# Patient Record
Sex: Female | Born: 1960 | Race: White | Hispanic: No | Marital: Married | Smoking: Never smoker
Health system: Southern US, Community
[De-identification: ages and names within clinical notes are randomized; demographics above are authoritative.]

## PROBLEM LIST (undated history)

## (undated) DIAGNOSIS — C4491 Basal cell carcinoma of skin, unspecified: Secondary | ICD-10-CM

## (undated) DIAGNOSIS — E7800 Pure hypercholesterolemia, unspecified: Secondary | ICD-10-CM

## (undated) DIAGNOSIS — E538 Deficiency of other specified B group vitamins: Secondary | ICD-10-CM

## (undated) HISTORY — DX: Basal cell carcinoma of skin, unspecified: C44.91

## (undated) HISTORY — PX: OTHER SURGICAL HISTORY: SHX169

## (undated) HISTORY — DX: Deficiency of other specified B group vitamins: E53.8

---

## 1990-09-26 HISTORY — PX: APPENDECTOMY: SHX54

## 2011-10-18 ENCOUNTER — Ambulatory Visit (INDEPENDENT_AMBULATORY_CARE_PROVIDER_SITE_OTHER): Payer: BC Managed Care – PPO

## 2011-10-18 DIAGNOSIS — H66009 Acute suppurative otitis media without spontaneous rupture of ear drum, unspecified ear: Secondary | ICD-10-CM

## 2011-10-18 DIAGNOSIS — H9209 Otalgia, unspecified ear: Secondary | ICD-10-CM

## 2011-10-18 DIAGNOSIS — H6120 Impacted cerumen, unspecified ear: Secondary | ICD-10-CM

## 2013-10-30 ENCOUNTER — Ambulatory Visit (INDEPENDENT_AMBULATORY_CARE_PROVIDER_SITE_OTHER): Payer: BC Managed Care – PPO | Admitting: Physician Assistant

## 2013-10-30 VITALS — BP 120/80 | HR 77 | Temp 98.0°F | Resp 16 | Ht 68.0 in | Wt 142.0 lb

## 2013-10-30 DIAGNOSIS — J029 Acute pharyngitis, unspecified: Secondary | ICD-10-CM

## 2013-10-30 DIAGNOSIS — H6980 Other specified disorders of Eustachian tube, unspecified ear: Secondary | ICD-10-CM

## 2013-10-30 DIAGNOSIS — H6690 Otitis media, unspecified, unspecified ear: Secondary | ICD-10-CM

## 2013-10-30 DIAGNOSIS — H6990 Unspecified Eustachian tube disorder, unspecified ear: Secondary | ICD-10-CM

## 2013-10-30 MED ORDER — AMOXICILLIN 875 MG PO TABS: 875.0000 mg | ORAL_TABLET | Freq: Two times a day (BID) | ORAL | Status: DC

## 2013-10-30 MED ORDER — IPRATROPIUM BROMIDE 0.03 % NA SOLN: 2.0000 | Freq: Two times a day (BID) | NASAL | Status: DC

## 2013-10-30 MED ORDER — FIRST-DUKES MOUTHWASH MT SUSP: 5.0000 mL | OROMUCOSAL | Status: DC | PRN

## 2013-10-30 NOTE — Progress Notes (Signed)
   Subjective:    Patient ID: Sonya Hunt, female    DOB: [birth year], 53 y.o.   MRN: [id number]  HPI 53 year old female presents for evaluation of acute onset of sore throat and left sided ear pain. States symptoms started last night and have progressively worsened though the day today.  Admits her pain is only on the left side of her throat and in her left ear.  Is able to swallow and does not have significant pain with swallowing.  She has taken Motrin and 1 Benadryl last night which helped some. Has slight nasal congestion and PND.  Denies cough, SOB, wheezing, chest pain, nausea, vomiting, headache, sinus pain, fever, or chills.  No known flu or strep contacts.   Patient is otherwise healthy with no other concerns today.     Review of Systems  Constitutional: Negative for fever and chills.  HENT: Positive for congestion, ear pain (left side), postnasal drip, rhinorrhea and sore throat (left side). Negative for sinus pressure.   Respiratory: Negative for cough, chest tightness, shortness of breath and wheezing.   Gastrointestinal: Negative for nausea, vomiting and abdominal pain.  Neurological: Negative for dizziness and headaches.       Objective:   Physical Exam  Constitutional: She is oriented to person, place, and time. She appears well-developed and well-nourished.  HENT:  Head: Normocephalic and atraumatic.  Right Ear: Hearing, tympanic membrane, external ear and ear canal normal.  Left Ear: Hearing, external ear and ear canal normal. Tympanic membrane is erythematous.  Mouth/Throat: Uvula is midline and mucous membranes are normal. Posterior oropharyngeal erythema (left side) present. No oropharyngeal exudate, posterior oropharyngeal edema or tonsillar abscesses.  Eyes: Conjunctivae are normal.  Neck: Normal range of motion. Neck supple.  Cardiovascular: Normal rate, regular rhythm and normal heart sounds.   Pulmonary/Chest: Effort normal and breath sounds normal.    Lymphadenopathy:    She has no cervical adenopathy.  Neurological: She is alert and oriented to person, place, and time.  Psychiatric: She has a normal mood and affect. Her behavior is normal. Judgment and thought content normal.          Assessment & Plan:  Otitis media - Plan: amoxicillin (AMOXIL) 875 MG tablet  ETD (eustachian tube dysfunction) - Plan: ipratropium (ATROVENT) 0.03 % nasal spray  Acute pharyngitis - Plan: Diphenhyd-Hydrocort-Nystatin (FIRST-DUKES MOUTHWASH) SUSP  Likely viral pharyngitis but will cover OM with amoxicillin twice daily x 10 days Atrovent NS twice daily to help with drainage and nasal congestion Increase fluids and rest. Duke's mouthwash q2-3hours prn pain Continue Motrin 600-800 mg tid prn pain Follow up if symptoms worsen or fail to improve.

## 2013-11-03 ENCOUNTER — Ambulatory Visit: Payer: BC Managed Care – PPO

## 2013-11-03 ENCOUNTER — Ambulatory Visit (INDEPENDENT_AMBULATORY_CARE_PROVIDER_SITE_OTHER): Payer: BC Managed Care – PPO | Admitting: Emergency Medicine

## 2013-11-03 VITALS — BP 128/68 | HR 78 | Temp 97.7°F | Ht 68.0 in | Wt 142.2 lb

## 2013-11-03 DIAGNOSIS — J029 Acute pharyngitis, unspecified: Secondary | ICD-10-CM

## 2013-11-03 DIAGNOSIS — H9202 Otalgia, left ear: Secondary | ICD-10-CM

## 2013-11-03 DIAGNOSIS — H9209 Otalgia, unspecified ear: Secondary | ICD-10-CM

## 2013-11-03 MED ORDER — ANTIPYRINE-BENZOCAINE 5.4-1.4 % OT SOLN: 3.0000 [drp] | OTIC | Status: DC | PRN

## 2013-11-03 MED ORDER — MELOXICAM 15 MG PO TABS: 15.0000 mg | ORAL_TABLET | Freq: Every day | ORAL | Status: DC

## 2013-11-03 NOTE — Patient Instructions (Signed)
Temporomandibular Problems  Temporomandibular joint (TMJ) dysfunction means there are problems with the joint between your jaw and your skull. This is a joint lined by cartilage like other joints in your body but also has a small disc in the joint which keeps the bones from rubbing on each other. These joints are like other joints and can get inflamed (sore) from arthritis and other problems. When this joint gets sore, it can cause headaches and pain in the jaw and the face. CAUSES  Usually the arthritic types of problems are caused by soreness in the joint. Soreness in the joint can also be caused by overuse. This may come from grinding your teeth. It may also come from mis-alignment in the joint. DIAGNOSIS Diagnosis of this condition can often be made by history and exam. Sometimes your caregiver may need X-rays or an MRI scan to determine the exact cause. It may be necessary to see your dentist to determine if your teeth and jaws are lined up correctly. TREATMENT  Most of the time this problem is not serious; however, sometimes it can persist (become chronic). When this happens medications that will cut down on inflammation (soreness) help. Sometimes a shot of cortisone into the joint will be helpful. If your teeth are not aligned it may help for your dentist to make a splint for your mouth that can help this problem. If no physical problems can be found, the problem may come from tension. If tension is found to be the cause, biofeedback or relaxation techniques may be helpful. HOME CARE INSTRUCTIONS   Later in the day, applications of ice packs may be helpful. Ice can be used in a plastic bag with a towel around it to prevent frostbite to skin. This may be used about every 2 hours for 20 to 30 minutes, as needed while awake, or as directed by your caregiver.  Only take over-the-counter or prescription medicines for pain, discomfort, or fever as directed by your caregiver.  If physical therapy was  prescribed, follow your caregiver's directions.  Wear mouth appliances as directed if they were given. Document Released: 06/07/2001 Document Revised: 12/05/2011 Document Reviewed: 09/14/2008 ExitCare Patient Information 2014 [employer].  

## 2013-11-03 NOTE — Progress Notes (Signed)
Subjective:    Patient ID: Sonya Hunt, female    DOB: 1961-08-20, 53 y.o.   MRN: [id number]  HPI This chart was scribed for Remo Lipps Arland Usery-MD, by Lovena Le Day, Scribe. This patient was seen in room 9 and the patient's care was started at 12:04 PM.  HPI Comments: Sonya Hunt is a 53 y.o. female who presents to the [facility] complaining of a constant, gradually worsened left otalgia and left sore throat, onset 4 days ago when she was seen here for the same. She reports was tx w/AMX and reports it feels the like a burning and aching pain to her left throat/left ear which has somewhat worsened since her last visit. She denies cough, fever. She states her right ear and right throat are fine and having no problems.   There are no active problems to display for this patient.  Past Surgical History  Procedure Laterality Date  . Appendectomy     Family History  Problem Relation Age of Onset  . Heart disease Mother   . Hypertension Mother   . Cancer Father   . Hypertension Brother    History   Social History  . Marital Status: Married    Spouse Name: N/A    Number of Children: N/A  . Years of Education: N/A   Occupational History  . Not on file.   Social History Main Topics  . Smoking status: Never Smoker   . Smokeless tobacco: Not on file  . Alcohol Use: No  . Drug Use: No  . Sexual Activity: Not on file   Other Topics Concern  . Not on file   Social History Narrative  . No narrative on file   No Known Allergies  No results found for this or any previous visit.  Review of Systems  Constitutional: Negative for fever and chills.  HENT: Positive for ear pain (left ear pain) and sore throat (left sided sore throat).   Respiratory: Negative for cough and shortness of breath.   Cardiovascular: Negative for chest pain.  Gastrointestinal: Negative for abdominal pain.  Musculoskeletal: Negative for back pain.      Objective:   Physical Exam    Nursing note and vitals reviewed. Constitutional: Patient is oriented to person, place, and time. Patient appears well-developed and well-nourished. No distress.  HENT:  Head: Normocephalic and atraumatic.  Neck: Neck supple. No tracheal deviation present.  Cardiovascular: Normal rate, regular rhythm and normal heart sounds.   No murmur heard. Pulmonary/Chest: Effort normal and breath sounds normal. No respiratory distress. Patient has no wheezes. Patient has no rales.  Musculoskeletal: Normal range of motion.  Neurological: Patient is alert and oriented to person, place, and time.  Skin: Skin is warm and dry.  Psychiatric: Patient has a normal mood and affect. Patient's behavior is normal.  Her left lower second molar is exposed and is missing the. Weber  Rinne testing are normal Triage Vitals: BP 128/68  Pulse 78  Temp(Src) 97.7 F (36.5 C) (Oral)  Ht 5\' 8"  (1.727 m)  Wt 142 lb 4 oz (64.524 kg)  BMI 21.63 kg/m2  SpO2 99%  DIAGNOSTIC STUDIES: Oxygen Saturation is 99% on room air, normal by my interpretation.    COORDINATION OF CARE: At 1203 PM Discussed treatment plan with patient which includes . Patient agrees.   [facility] reading (PRIMARY) by  Dr. Everlene Farrier no acute disease.      Assessment & Plan:  No source for the ear pain  found. 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 She is requesting a row given eardrops which are okay with me. Also put her on Mobic 15 mg one a day and advised her to see her dentist.

## 2013-12-25 ENCOUNTER — Ambulatory Visit: Payer: BC Managed Care – PPO

## 2013-12-25 ENCOUNTER — Ambulatory Visit (INDEPENDENT_AMBULATORY_CARE_PROVIDER_SITE_OTHER): Payer: BC Managed Care – PPO | Admitting: Family Medicine

## 2013-12-25 VITALS — BP 140/80 | HR 79 | Temp 98.1°F | Resp 16 | Ht 68.0 in | Wt 143.0 lb

## 2013-12-25 DIAGNOSIS — R059 Cough, unspecified: Secondary | ICD-10-CM

## 2013-12-25 DIAGNOSIS — J209 Acute bronchitis, unspecified: Secondary | ICD-10-CM

## 2013-12-25 DIAGNOSIS — R05 Cough: Secondary | ICD-10-CM

## 2013-12-25 MED ORDER — HYDROCODONE-HOMATROPINE 5-1.5 MG/5ML PO SYRP: 5.0000 mL | ORAL_SOLUTION | Freq: Three times a day (TID) | ORAL | Status: DC | PRN

## 2013-12-25 MED ORDER — AZITHROMYCIN 250 MG PO TABS: ORAL_TABLET | ORAL | Status: DC

## 2013-12-25 NOTE — Progress Notes (Addendum)
° °  Subjective:    Patient ID: Sonya Hunt, female    DOB: 1961-03-23, 53 y.o.   MRN: [id number]  This chart was scribed for Robyn Haber, MD by Maree Erie, ED Scribe.   Chief Complaint  Patient presents with   Cough    chest pain from coughing x 3 days    PCP: No primary provider on file.   HPI  Sonya Hunt is a 53 y.o. female who presents to office complaining of a productive cough that began three days ago. She states that the cough started out mildly but a few hours later she had sudden onset of chills, low grade fever and worsening cough. Since that day she has not had fever or chills. However, she reports a persistent, unchanged cough and an associated "heaviness" in her chest with the coughing. She also reports mild congestion. She denies sore throat. She denies a history of asthma. She does not smoke.   She works at [employer].     History reviewed. No pertinent past medical history.   Review of Systems Review of Systems: Consitutional: No fever, chills, fatigue, night sweats, lymphadenopathy, or weight changes. Eyes: No visual changes, eye redness, or discharge. ENT/Mouth: Ears: No otalgia, tinnitus, hearing loss, discharge. Nose: Positive for congestion, rhinorrhea. No sinus pain, or epistaxis. Throat: No sore throat, post nasal drip, or teeth pain. Cardiovascular: No CP, palpitations, diaphoresis, DOE, edema, orthopnea, PND. Respiratory: Positive for cough, No hemoptysis, SOB, or wheezing. Gastrointestinal: No anorexia, dysphagia, reflux, pain, nausea, vomiting, hematemesis, diarrhea, constipation, BRBPR, or melena. Genitourinary: No dysuria, frequency, urgency, hematuria, incontinence, nocturia, decreased urinary stream, discharge, impotence, or testicular pain/masses. Musculoskeletal: No decreased ROM, myalgias, stiffness, joint swelling, or weakness. Skin: No rash, erythema, lesion changes, pain, warmth, jaundice, or pruritis. Neurological:  No headache, dizziness, syncope, seizures, tremors, memory loss, coordination problems, or paresthesias. Psychological: No anxiety, depression, hallucinations, SI/HI. Endocrine: No fatigue, polydipsia, polyphagia or polyuria. All other systems were reviewed and are otherwise negative.     Objective:   Physical Exam BP 140/80   Pulse 79   Temp(Src) 98.1 F (36.7 C) (Oral)   Resp 16   Ht 5\' 8"  (1.727 m)   Wt 143 lb (64.864 kg)   BMI 21.75 kg/m2   SpO2 98%  General: Well-developed, well-nourished female in no acute distress; appearance consistent with age of record HENT: normocephalic; atraumatic Eyes: pupils equal, round and reactive to light; extraocular muscles intact Neck: supple Heart: regular rate and rhythm; no murmurs, rubs or gallops Lungs: clear to auscultation bilaterally, decreased BS on right Abdomen: soft; nondistended; nontender; no masses or hepatosplenomegaly; bowel sounds present Extremities: No deformity; full range of motion; pulses normal Neurologic: Awake, alert and oriented; motor function intact in all extremities and symmetric; no facial droop Skin: Warm and dry Psychiatric: Normal mood and affect [facility] reading (PRIMARY) by  Dr. Joseph Art CXR.no infiltrate   Assessment & Plan:   I personally performed the services described in this documentation, which was scribed in my presence. The recorded information has been reviewed and is accurate.  Acute bronchitis - Plan: azithromycin (ZITHROMAX Z-PAK) 250 MG tablet, HYDROcodone-homatropine (HYCODAN) 5-1.5 MG/5ML syrup  Cough - Plan: DG Chest 2 View  Signed, Robyn Haber, MD  PS patient plans to get complete physical and up-to-date on preventative care in the next 3 months.

## 2014-04-12 ENCOUNTER — Emergency Department (HOSPITAL_COMMUNITY)
Admission: EM | Admit: 2014-04-12 | Discharge: 2014-04-12 | Disposition: A | Discharge status: Home / Self Care | Payer: BC Managed Care – PPO | Attending: Emergency Medicine | Admitting: Emergency Medicine

## 2014-04-12 ENCOUNTER — Emergency Department (HOSPITAL_COMMUNITY): Payer: BC Managed Care – PPO

## 2014-04-12 ENCOUNTER — Encounter (HOSPITAL_COMMUNITY): Payer: Self-pay | Admitting: Emergency Medicine

## 2014-04-12 DIAGNOSIS — R000 Tachycardia, unspecified: Secondary | ICD-10-CM | POA: Insufficient documentation

## 2014-04-12 DIAGNOSIS — R002 Palpitations: Secondary | ICD-10-CM | POA: Insufficient documentation

## 2014-04-12 DIAGNOSIS — R0789 Other chest pain: Secondary | ICD-10-CM | POA: Insufficient documentation

## 2014-04-12 LAB — CBC
HCT: 39.9 % (ref 36.0–46.0)
Hemoglobin: 13.6 g/dL (ref 12.0–15.0)
MCH: 30.4 pg (ref 26.0–34.0)
MCHC: 34.1 g/dL (ref 30.0–36.0)
MCV: 89.3 fL (ref 78.0–100.0)
Platelets: 227 10*3/uL (ref 150–400)
RBC: 4.47 MIL/uL (ref 3.87–5.11)
RDW: 12.6 % (ref 11.5–15.5)
WBC: 7 10*3/uL (ref 4.0–10.5)

## 2014-04-12 LAB — I-STAT TROPONIN, ED: Troponin i, poc: 0 ng/mL (ref 0.00–0.08)

## 2014-04-12 LAB — BASIC METABOLIC PANEL
Anion gap: 20 — ABNORMAL HIGH (ref 5–15)
BUN: 11 mg/dL (ref 6–23)
CO2: 23 mEq/L (ref 19–32)
Calcium: 9.5 mg/dL (ref 8.4–10.5)
Chloride: 99 mEq/L (ref 96–112)
Creatinine, Ser: 0.67 mg/dL (ref 0.50–1.10)
GFR calc Af Amer: >90 mL/min (ref >90)
GFR calc non Af Amer: >90 mL/min (ref >90)
Glucose, Bld: 101 mg/dL — ABNORMAL HIGH (ref 70–99)
Potassium: 3.4 mEq/L — ABNORMAL LOW (ref 3.7–5.3)
Sodium: 142 mEq/L (ref 137–147)

## 2014-04-12 MED ORDER — POTASSIUM CHLORIDE CRYS ER 20 MEQ PO TBCR
40.0000 meq | EXTENDED_RELEASE_TABLET | Freq: Two times a day (BID) | ORAL | Status: DC
  Administered 2014-04-12: 40 meq via ORAL
  Filled 2014-04-12: qty 2

## 2014-04-12 NOTE — Discharge Instructions (Signed)
Nonspecific Tachycardia  Tachycardia is a faster than normal heartbeat (more than 100 beats per minute). In adults, the heart normally beats between 60 and 100 times a minute. A fast heartbeat may be a normal response to exercise or stress. It does not necessarily mean that something is wrong. However, sometimes when your heart beats too fast it may not be able to pump enough blood to the rest of your body. This can result in chest pain, shortness of breath, dizziness, and even fainting. Nonspecific tachycardia means that the specific cause or pattern of your tachycardia is unknown.  CAUSES   Tachycardia may be harmless or it may be due to a more serious underlying cause. Possible causes of tachycardia include:  · Exercise or exertion.  · Fever.  · Pain or injury.  · Infection.  · Loss of body fluids (dehydration).  · Overactive thyroid.  · Lack of red blood cells (anemia).  · Anxiety and stress.  · Alcohol.  · Caffeine.  · Tobacco products.  · Diet pills.  · Illegal drugs.  · Heart disease.  SYMPTOMS  · Rapid or irregular heartbeat (palpitations).  · Suddenly feeling your heart beating (cardiac awareness).  · Dizziness.  · Tiredness (fatigue).  · Shortness of breath.  · Chest pain.  · Nausea.  · Fainting.  DIAGNOSIS   Your caregiver will perform a physical exam and take your medical history. In some cases, a heart specialist (cardiologist) may be consulted. Your caregiver may also order:  · Blood tests.  · Electrocardiography. This test records the electrical activity of your heart.  · A heart monitoring test.  TREATMENT   Treatment will depend on the likely cause of your tachycardia. The goal is to treat the underlying cause of your tachycardia. Treatment methods may include:  · Replacement of fluids or blood through an intravenous (IV) tube for moderate to severe dehydration or anemia.  · New medicines or changes in your current medicines.  · Diet and lifestyle changes.  · Treatment for certain  infections.  · Stress relief or relaxation methods.  HOME CARE INSTRUCTIONS   · Rest.  · Drink enough fluids to keep your urine clear or pale yellow.  · Do not smoke.  · Avoid:  ¨ Caffeine.  ¨ Tobacco.  ¨ Alcohol.  ¨ Chocolate.  ¨ Stimulants such as over-the-counter diet pills or pills that help you stay awake.  ¨ Situations that cause anxiety or stress.  ¨ Illegal drugs such as marijuana, phencyclidine (PCP), and cocaine.  · Only take medicine as directed by your caregiver.  · Keep all follow-up appointments as directed by your caregiver.  SEEK IMMEDIATE MEDICAL CARE IF:   · You have pain in your chest, upper arms, jaw, or neck.  · You become weak, dizzy, or feel faint.  · You have palpitations that will not go away.  · You vomit, have diarrhea, or pass blood in your stool.  · Your skin is cool, pale, and wet.  · You have a fever that will not go away with rest, fluids, and medicine.  MAKE SURE YOU:   · Understand these instructions.  · Will watch your condition.  · Will get help right away if you are not doing well or get worse.  Document Released: 10/20/2004 Document Revised: 12/05/2011 Document Reviewed: 08/23/2011  ExitCare® Patient Information ©2015 [employer]. This information is not intended to replace advice given to you by your health care provider. Make sure you discuss any questions   you have with your health care provider.

## 2014-04-12 NOTE — ED Notes (Signed)
Patient transported to X-ray 

## 2014-04-12 NOTE — ED Notes (Signed)
Reports not feeling her norm for the past few days, feels like heart is racing. Mild pain and "feeling funny" to right arm. HR 130 at triage.

## 2014-04-14 NOTE — ED Provider Notes (Signed)
CSN: [id number]     Arrival date & time 04/12/14  1521 History   First MD Initiated Contact with Patient 04/12/14 1604     Chief Complaint  Patient presents with  . Tachycardia     (Consider location/radiation/quality/duration/timing/severity/associated sxs/prior Treatment) Patient is a 53 y.o. female presenting with palpitations.  Palpitations Palpitations quality:  Regular Onset quality:  Sudden Duration:  3 weeks Timing:  Constant Progression:  Waxing and waning Chronicity:  New Associated symptoms: no back pain, no chest pain, no cough, no nausea, no shortness of breath and no vomiting     History reviewed. No pertinent past medical history. Past Surgical History  Procedure Laterality Date  . Appendectomy     Family History  Problem Relation Age of Onset  . Heart disease Mother   . Hypertension Mother   . Cancer Father   . Hypertension Brother    History  Substance Use Topics  . Smoking status: Never Smoker   . Smokeless tobacco: Not on file  . Alcohol Use: No   OB History   Grav Para Term Preterm Abortions TAB SAB Ect Mult Living                 Review of Systems  Constitutional: Negative for activity change.  HENT: Negative for congestion.   Respiratory: Positive for chest tightness. Negative for cough and shortness of breath.   Cardiovascular: Positive for palpitations. Negative for chest pain and leg swelling.  Gastrointestinal: Negative for nausea, vomiting, abdominal pain, diarrhea, constipation, blood in stool and abdominal distention.  Genitourinary: Negative for dysuria, flank pain and vaginal discharge.  Musculoskeletal: Negative for back pain.  Skin: Negative for color change.  Neurological: Negative for syncope and headaches.  Psychiatric/Behavioral: Negative for agitation.  All other systems reviewed and are negative.     Allergies  Review of patient's allergies indicates no known allergies.  Home Medications   Prior to Admission  medications   Not on File   BP 133/76  Pulse 81  Temp(Src) 98.3 F (36.8 C) (Oral)  Resp 11  Ht 5\' 8"  (1.727 m)  Wt 140 lb (63.504 kg)  BMI 21.29 kg/m2  SpO2 99% Physical Exam  Constitutional: She is oriented to person, place, and time. She appears well-developed.  HENT:  Head: Normocephalic.  Eyes: Pupils are equal, round, and reactive to light.  Neck: Neck supple.  Cardiovascular: Normal rate.  Exam reveals no gallop and no friction rub.   No murmur heard. Normal HR at time of my exam   Pulmonary/Chest: Effort normal and breath sounds normal. No respiratory distress.  Abdominal: Soft. She exhibits no distension. There is no tenderness. There is no rebound.  Musculoskeletal: She exhibits no edema.  Neurological: She is alert and oriented to person, place, and time.  Skin: Skin is warm.  Psychiatric: She has a normal mood and affect.    ED Course  Procedures (including critical care time) Labs Review Labs Reviewed  BASIC METABOLIC PANEL - Abnormal; Notable for the following:    Potassium 3.4 (*)    Glucose, Bld 101 (*)    Anion gap 20 (*)    All other components within normal limits  CBC  I-STAT TROPOININ, ED    Imaging Review Dg Chest 2 View  04/12/2014   CLINICAL DATA:  Tachycardia.  EXAM: CHEST  2 VIEW  COMPARISON:  December 25, 2013.  FINDINGS: The heart size and mediastinal contours are within normal limits. No pneumothorax or pleural effusion is noted.  Both lungs are clear. The visualized skeletal structures are unremarkable.  IMPRESSION: No acute cardiopulmonary abnormality seen.   Electronically Signed   By: Sabino Dick M.D.   On: 04/12/2014 16:45     EKG Interpretation   Date/Time:  Saturday April 12 2014 20:29:13 EDT Ventricular Rate:  74 PR Interval:  149 QRS Duration: 87 QT Interval:  384 QTC Calculation: 426 R Axis:   75 Text Interpretation:  Sinus rhythm ED PHYSICIAN INTERPRETATION AVAILABLE  IN [facility] Confirmed by TEST, Record ([id number]) on  04/14/2014 7:18:51  AM      MDM   Final diagnoses:  Tachycardia  53 year old female with no significant past medical history the presents with tachycardia. Patient states that she's been feeling intermittent episodes of palpitations for the past 3 weeks. Patient cannot identify any alleviating or aggravating factors for this. Patient was concerned and states that his primary care doctor so and presented to the emergency department. Patient was noted to have a heart rate of 1:30 in triage. EKG was performed at that time and she was noted to have sinus tachycardia. No evidence of ischemia seen.  Patient denies any shortness of breath chest pain states that she is physically active and not a smoker. During the exam the patient has a heart rate of 84 without intervention. Orthostatic blood pressures were performed patient is not orthostatic. Screening labs were obtained and were noncontributory. Troponin negative. Repeat EKG obtained the patient is noted to have normal sinus rhythm with no evidence of ischemia.  At this time the patient was discharged home with instructions to establish a primary care doctor in contact information provided. Contact information for cardiology also provided for a nonurgent appointment. At this time the patient was discharged home with return precautions given     Claudean Severance, MD 04/14/14 Rogers, MD 04/14/14 [id number]

## 2014-04-17 NOTE — ED Provider Notes (Signed)
I saw and evaluated the patient, reviewed the resident's note and I agree with the findings and plan.   EKG Interpretation   Date/Time:  Saturday April 12 2014 20:29:13 EDT Ventricular Rate:  74 PR Interval:  149 QRS Duration: 87 QT Interval:  384 QTC Calculation: 426 R Axis:   75 Text Interpretation:  Sinus rhythm ED PHYSICIAN INTERPRETATION AVAILABLE  IN [facility] Confirmed by TEST, Record ([id number]) on 04/14/2014 7:18:51  AM      53 year old female with palpitations which she describes as a rapid heartbeat. Intermittent for the past few weeks. No appreciable exacerbating relieving factors. EKG shows sinus tachycardia. When I examined her she had a heart rate approximately 80 while laying in bed. No significant ectopy noted on the monitor her during her auscultation. Workup has been fairly unremarkable. Aside from tachycardia there is nothing making BP to the concern for possible pulmonary embolism. At this point I feel she is stable for discharge. Needs to followup with PCP. May benefit from Holter monitor for persistent symptoms. Return precautions were discussed.  Virgel Manifold, MD 04/17/14 [phone]

## 2014-05-15 ENCOUNTER — Ambulatory Visit (INDEPENDENT_AMBULATORY_CARE_PROVIDER_SITE_OTHER): Payer: BC Managed Care – PPO | Admitting: Cardiovascular Disease

## 2014-05-15 VITALS — BP 144/86 | HR 67 | Ht 69.0 in | Wt 142.0 lb

## 2014-05-15 DIAGNOSIS — R000 Tachycardia, unspecified: Secondary | ICD-10-CM

## 2014-05-15 DIAGNOSIS — R002 Palpitations: Secondary | ICD-10-CM

## 2014-05-15 NOTE — Patient Instructions (Signed)
The current medical regimen is effective;  continue present plan and medications.  Follow up in 3 months with Dr Johnsie Cancel.

## 2014-05-15 NOTE — Progress Notes (Signed)
Patient ID: ALAYASIA BREEDING, female   DOB: [birth year], 53 y.o.   MRN: [id number]  53 yo seen in ER 7/18 for palpitations and tachycardia.  Been going on snce May.  She has 3 children one with POTS and one with Aspergers.  She is active and runs 3 x / week.  Has had stereotypical episodes of arm and leg tingling, followed by nausea and then palpitations/ tachycardia.  Feels odd for minutes then slowly resolves. No dyspnea chest pain or syncope.  Never been sick before Reviewed ER records and ECG normal one show sinus tachycardia no arrhyhtmia  Labs ok  No TSH but she indicates having normal thyroid studies with Dr Kelton Pillar in the last month  Not clear if she has reached menapause but periods are very infrequent.  Denies ETOH, smoking drugs , stimulants or excess caffeine  Does not have vascular disease or Raynauds      Hct 39 K 3.4    ROS: Denies fever, malais, weight loss, blurry vision, decreased visual acuity, cough, sputum, SOB, hemoptysis, pleuritic pain, palpitaitons, heartburn, abdominal pain, melena, lower extremity edema, claudication, or rash.  All other systems reviewed and negative   General: Affect appropriate Healthy:  appears stated age 27: normal Neck supple with no adenopathy JVP normal no bruits no thyromegaly Lungs clear with no wheezing and good diaphragmatic motion Heart:  S1/S2 no murmur,rub, gallop or click PMI normal Abdomen: benighn, BS positve, no tenderness, no AAA no bruit.  No HSM or HJR Distal pulses intact with no bruits No edema Neuro non-focal Skin warm and dry No muscular weakness  Medications No current outpatient prescriptions on file.   No current facility-administered medications for this visit.    Allergies Review of patient's allergies indicates no known allergies.  Family History: Family History  Problem Relation Age of Onset  . Heart disease Mother   . Hypertension Mother   . Cancer Father   . Hypertension Brother     Social  History: History   Social History  . Marital Status: Married    Spouse Name: N/A    Number of Children: N/A  . Years of Education: N/A   Occupational History  . Not on file.   Social History Main Topics  . Smoking status: Never Smoker   . Smokeless tobacco: Not on file  . Alcohol Use: No  . Drug Use: No  . Sexual Activity: Not on file   Other Topics Concern  . Not on file   Social History Narrative  . No narrative on file    Electrocardiogram:  ST rate 119  No PSVT normal otherwise  04/12/14  Today SR rate 67 normal ECG   Assessment and Plan

## 2014-05-15 NOTE — Assessment & Plan Note (Signed)
No evidence of arrhythmia.  Normal Exam and ECG  Has been fine last 3 weeks.  Encouraged her to f/u with Ob ? menapause with peripheral vasoreactivity.  Offerred ETT/Echo but we agreed to observe for now and f/u in 3 months as she is improved and there is no evidence of significant heart issue

## 2014-06-09 ENCOUNTER — Encounter: Payer: Self-pay | Admitting: Neurology

## 2014-06-09 ENCOUNTER — Ambulatory Visit (INDEPENDENT_AMBULATORY_CARE_PROVIDER_SITE_OTHER): Payer: BC Managed Care – PPO | Admitting: Neurology

## 2014-06-09 ENCOUNTER — Encounter (INDEPENDENT_AMBULATORY_CARE_PROVIDER_SITE_OTHER): Payer: Self-pay

## 2014-06-09 VITALS — BP 126/68 | HR 85 | Ht 67.0 in | Wt 144.0 lb

## 2014-06-09 DIAGNOSIS — G2581 Restless legs syndrome: Secondary | ICD-10-CM

## 2014-06-09 DIAGNOSIS — R209 Unspecified disturbances of skin sensation: Secondary | ICD-10-CM

## 2014-06-09 DIAGNOSIS — R202 Paresthesia of skin: Secondary | ICD-10-CM

## 2014-06-09 MED ORDER — GABAPENTIN 300 MG PO CAPS: 300.0000 mg | ORAL_CAPSULE | Freq: Every day | ORAL | Status: DC

## 2014-06-09 NOTE — Progress Notes (Signed)
[facility] Provider:  Dr Jaynee Eagles Referring Provider: Kelton Pillar, MD Primary Care Physician:  Osborne Casco, MD  CC:  Paresthesias  HPI:  Sonya Hunt is a 53 y.o. female here as a referral from Dr. Laurann Montana for Paresthesias. The week after mother's day she felt numbness, tingling in all the limbs. Started in the right arm, like her arm was asleep. Also numbness and tingly in the lips. Started slowly and progressed. Got significantly worse in May. Went away for a week with just some paresthesias at night. Is waxing and waning. At it's height in July around the 18th she had a week of the symptoms and it was getting very intense and her heart started beating really fast, they went to the emergency room and she was tachycardic. Paresthesias went up to 8/10 in discomfort. They go away for a few weeks and then come back. Symptoms always start slowly and ascend with a strange sensation of paresthesias and numbness, always starting in the right arm. No neck pain, no back pain, no weakness. She has warm sensations, maybe entering menopause, b which tends to happen at the same time as the paresthesias. No diabetes. She works out regularly, runs. Went ot the cardiologist and EKG was normal. Right arm always more intense. No other focal symptoms, denies weakness, vision changes, speech or swallow problems. She has an overactive bladder. Nothing seems to make symptoms worse or trigger them, better if walking around or moving. No SOB. Sometimes when sitting for long periods has the urge to move legs and also gets the same sensations in bed, like she has to move legs, as in restless leg syndrome. Not sleeping well, wakes every hour or two. Not snoring, no apneic episodes. Partner doesn't notice abnormal movements. No recent depression or stress. Life has been less stressful since son moved out of house and she feels good. No cramping. Balance is fine. No dizzyness. Brain feels "fuzzy" at  times. No recent illness or preceding illness. Some cognitive "fuzziness" for a few months. No headaches. Today her lips are numb symmetrically around the lips.  She currently has the paresthesias, started 8 days ago.  Previous to this this she had gone 21 days without symptoms, had a small bout the beginning of august. Describes symptoms of pins and needles up the whole arm up to shoulder on right, left arm in the fingers, legs to the knees, and around the lips.     Reviewed notes, labs and imaging from outside physicians, which showed recent CBC unremarkable, BMP with K 3.4. TSH 2.08.   Review of Systems: Patient complains of symptoms per HPI as well as the following symptoms palpitations, ringing in ears, feeling hot, numbness. Pertinent negatives per HPI. Otherwise out of a complete 14 system review, and all other reviewed systems are negative.   History   Social History  . Marital Status: Married    Spouse Name: Sonya Hunt     Number of Children: 3  . Years of Education: 12+   Occupational History  . enrollment    Social History Main Topics  . Smoking status: Never Smoker   . Smokeless tobacco: Never Used  . Alcohol Use: No  . Drug Use: No  . Sexual Activity: Not on file   Other Topics Concern  . Not on file   Social History Narrative   Patient lives at home with husband Sonya Hunt.    Patient has 3 children.    Patient has a college education.  Patient is right handed.    Patient is currently employed at [employer].     Family History  Problem Relation Age of Onset  . Heart disease Mother   . Hypertension Mother   . Prostate cancer Father   . Hypertension Brother     Past Medical History  Diagnosis Date  . Basal cell carcinoma     Past Surgical History  Procedure Laterality Date  . Appendectomy  1992  . Excision of bcc r arm      Current Outpatient Prescriptions  Medication Sig Dispense Refill  . gabapentin (NEURONTIN) 300 MG capsule Take  1 capsule (300 mg total) by mouth at bedtime.  30 capsule  3   No current facility-administered medications for this visit.    Allergies as of 06/09/2014  . (No Known Allergies)    Vitals: BP 126/68  Pulse 85  Ht 5\' 7"  (1.702 m)  Wt 144 lb (65.318 kg)  BMI 22.55 kg/m2 Last Weight:  Wt Readings from Last 1 Encounters:  06/09/14 144 lb (65.318 kg)   Last Height:   Ht Readings from Last 1 Encounters:  06/09/14 5\' 7"  (1.702 m)   Physical exam: Exam: Gen: NAD, conversant Eyes: anicteric sclerae, moist conjunctivae HENT: Atraumatic, oropharynx clear Neck: Trachea midline; supple,  Lungs: CTA, no wheezing, rales, rhonic                          CV: RRR, no MRG Abdomen: Soft, non-tender;  Extremities: No peripheral edema  Skin: Normal temperature, no rash,  Psych: Appropriate affect, pleasant  Neuro: Detailed Neurologic Exam  Speech:    Speech is normal; fluent and spontaneous with normal comprehension.  Cognition:    The patient is oriented to person, place, and time; memory intact; language fluent; normal attention, concentration, and fund of knowledge.  Cranial Nerves:    The pupils are equal, round, and reactive to light. The fundi are normal and spontaneous venous pulsations are present. Visual fields are full to finger confrontation. Extraocular movements are intact. Trigeminal sensation is intact and the muscles of mastication are normal. The face is symmetric. The palate elevates in the midline. Voice is normal. Shoulder shrug is normal. The tongue has normal motion without fasciculations.   Coordination:    Normal finger to nose and heel to shin. Normal rapid alternating movements.   Gait:    Heel-toe and tandem gait are normal.   Motor Observation:    No asymmetry, no atrophy, and no involuntary movements noted. Tone:    Normal muscle tone.    Posture:    Posture is normal. normal erect    Strength:    Strength is V/V in the upper and lower limbs.        Vibratory Sensation:    Normal vibratory sensation in upper and lower extremities.   Light Touch:    Normal light touch sensation in upper and lower extremities.     Proprioception:    Normal proprioception in upper and lower extremities.  Pin Prick:    Normal sensation to pinprick in upper and lower extremities.    Temperature:    Normal temperature sensation in upper and lower extremities.  Reflex Exam:  DTR's:    Deep tendon reflexes in the upper and lower extremities are normal bilaterally.   Toes:    The toes are downgoing bilaterally.   Clonus:    Clonus is absent.  Assessment/Plan:  53 year old female here with waxing and waning paresthesias in the extremities. Symptoms start in the right arm then progress to include all extremities, resolve for a few weeks then repeat again. Will persist for a week or 2 and then reoccur in the same pattern. She also feels a buzzing like a cell phone on her. Her lips feel tingly, in a circular pattern around the mouth. She is having episodic feelings of heat that correspond to the paresthesias and thinks possibly it is menopause related. No other focal neurologic deficits. Neuro exam is normal. She has had episodes of tachycardia and has been evaluated by cardiology. Hyperventilation in the office failed to recreate her symptoms. Given the assymetry and symptoms in face will order an MRI of the brain. Will order a neuropathy screen. Will treat with neurontin qhs. Ferritin lab for RLS. Will also order an emg/ncs. Will consider EEG if all else is negative.   Sarina Ill, MD  [facility] [address] [place], [place] [address]  Phone [phone] Fax [phone]

## 2014-06-09 NOTE — Patient Instructions (Addendum)
Overall you are doing fairly well but I do want to suggest a few things today:   Remember to drink plenty of fluid, eat healthy meals and do not skip any meals. Try to eat protein with a every meal and eat a healthy snack such as fruit or nuts in between meals. Try to keep a regular sleep-wake schedule and try to exercise daily, particularly in the form of walking, 20-30 minutes a day, if you can.   Neurontin 300mg  at night when needed for RLS as well as paresthesias   As far as diagnostic testing: MRI of the brain and [place]  I would like to see you back in 3 months, sooner if we need to. Please call us with any interim questions, concerns, problems, updates or refill requests.   Please also call us for any test results so we can go over those with you on the phone.  My clinical assistant and will answer any of your questions and relay your messages to me and also relay most of my messages to you.   Our phone number is [phone]. We also have an after hours call service for urgent matters and there is a physician on-call for urgent questions. For any emergencies you know to call 911 or go to the nearest emergency room

## 2014-06-10 LAB — FERRITIN: Ferritin: 61 ng/mL (ref 15–150)

## 2014-06-11 ENCOUNTER — Other Ambulatory Visit: Payer: Self-pay | Admitting: Neurology

## 2014-06-11 ENCOUNTER — Telehealth: Payer: Self-pay | Admitting: Neurology

## 2014-06-11 DIAGNOSIS — E538 Deficiency of other specified B group vitamins: Secondary | ICD-10-CM

## 2014-06-11 DIAGNOSIS — Z13828 Encounter for screening for other musculoskeletal disorder: Secondary | ICD-10-CM

## 2014-06-11 NOTE — Telephone Encounter (Signed)
Called patient but she didn't pick up and her mailbox was full. Her B12 was low so I want to do a few more tests. Will ask stephanie to call as I will be out of the office tomorrow.

## 2014-06-12 ENCOUNTER — Telehealth: Payer: Self-pay | Admitting: Neurology

## 2014-06-12 LAB — IFE AND PE, SERUM
ALBUMIN SERPL ELPH-MCNC: 3.9 g/dL (ref 3.2–5.6)
ALBUMIN/GLOB SERPL: 1.3 (ref 0.7–2.0)
Alpha 1: 0.2 g/dL (ref 0.1–0.4)
Alpha2 Glob SerPl Elph-Mcnc: 0.8 g/dL (ref 0.4–1.2)
B-Globulin SerPl Elph-Mcnc: 1.1 g/dL (ref 0.6–1.3)
Gamma Glob SerPl Elph-Mcnc: 1.1 g/dL (ref 0.5–1.6)
Globulin, Total: 3.1 g/dL (ref 2.0–4.5)
IGA/IMMUNOGLOBULIN A, SERUM: 122 mg/dL (ref 91–414)
IGG (IMMUNOGLOBIN G), SERUM: 994 mg/dL (ref 700–1600)
IGM (IMMUNOGLOBULIN M), SRM: 117 mg/dL (ref 40–230)
TOTAL PROTEIN: 7 g/dL (ref 6.0–8.5)

## 2014-06-12 LAB — HEPATIC FUNCTION PANEL
ALT: 13 IU/L (ref 0–32)
AST: 13 IU/L (ref 0–40)
Albumin: 4.7 g/dL (ref 3.5–5.5)
Alkaline Phosphatase: 66 IU/L (ref 39–117)
BILIRUBIN DIRECT: 0.14 mg/dL (ref 0.00–0.40)
Total Bilirubin: 0.6 mg/dL (ref 0.0–1.2)

## 2014-06-12 LAB — HEAVY METALS, BLOOD
Arsenic: 6 ug/L (ref 2–23)
LEAD, BLOOD: NOT DETECTED ug/dL (ref 0–19)
Mercury: NOT DETECTED ug/L (ref 0.0–14.9)

## 2014-06-12 LAB — VITAMIN B12: Vitamin B-12: 168 pg/mL — ABNORMAL LOW (ref 211–946)

## 2014-06-12 LAB — HIV ANTIBODY (ROUTINE TESTING W REFLEX)
HIV 1/O/2 Abs-Index Value: <1 (ref <1.00)
HIV-1/HIV-2 Ab: NONREACTIVE

## 2014-06-12 LAB — ANA W/REFLEX: ANA: NEGATIVE

## 2014-06-12 LAB — ANGIOTENSIN CONVERTING ENZYME: Angio Convert Enzyme: <14 U/L — ABNORMAL LOW (ref 14–82)

## 2014-06-12 LAB — RHEUMATOID FACTOR: Rhuematoid fact SerPl-aCnc: 15.6 IU/mL — ABNORMAL HIGH (ref 0.0–13.9)

## 2014-06-12 LAB — LYME, TOTAL AB TEST/REFLEX: Lyme IgG/IgM Ab: <0.91 {ISR} (ref 0.00–0.90)

## 2014-06-12 LAB — RPR: RPR: NONREACTIVE

## 2014-06-12 LAB — SEDIMENTATION RATE: Sed Rate: 4 mm/hr (ref 0–40)

## 2014-06-12 NOTE — Telephone Encounter (Signed)
Return call. No answer. Left vmail

## 2014-06-12 NOTE — Telephone Encounter (Signed)
Patient returning call to Dr. Jaynee Eagles regarding results, please call and advise.

## 2014-06-12 NOTE — Telephone Encounter (Signed)
Spoke to patient and she is aware of her results. Patient will pick up some B12 vitamins and take 1000 mcg when she gets a chance. Patient will also stop by the office in the next few days to get additional labs drawn.

## 2014-06-16 ENCOUNTER — Other Ambulatory Visit (INDEPENDENT_AMBULATORY_CARE_PROVIDER_SITE_OTHER): Payer: Self-pay

## 2014-06-16 ENCOUNTER — Other Ambulatory Visit: Payer: Self-pay | Admitting: Neurology

## 2014-06-16 DIAGNOSIS — E538 Deficiency of other specified B group vitamins: Secondary | ICD-10-CM

## 2014-06-16 DIAGNOSIS — M059 Rheumatoid arthritis with rheumatoid factor, unspecified: Secondary | ICD-10-CM

## 2014-06-16 DIAGNOSIS — Z0289 Encounter for other administrative examinations: Secondary | ICD-10-CM

## 2014-06-18 LAB — CYCLIC CITRUL PEPTIDE ANTIBODY, IGG/IGA

## 2014-06-18 LAB — VITAMIN B1, WHOLE BLOOD: Thiamine: 144.6 nmol/L (ref 66.5–200.0)

## 2014-06-18 LAB — INTRINSIC FACTOR BLOCKING ANTIBODY: Intrinsic Factor Abs, Serum: 0.9 AU/mL (ref 0.0–1.1)

## 2014-06-18 LAB — FOLATE: Folate: 14.6 ng/mL (ref >3.0)

## 2014-06-20 ENCOUNTER — Telehealth: Payer: Self-pay | Admitting: Neurology

## 2014-06-20 NOTE — Telephone Encounter (Signed)
Left detailed message. RF was elevated mildly but patient did not report any joitn pain, follow up labs negative. Will just monitor and discuss at next appointment, I think a Rheum referral will be low yield but happy to do whatever patient feels comfortable. She does have low B12 which can be the cause of sensory problems, suggested daily suplementation abd follow up here.

## 2014-07-07 ENCOUNTER — Encounter: Payer: BC Managed Care – PPO | Admitting: Neurology

## 2014-08-15 ENCOUNTER — Ambulatory Visit: Payer: BC Managed Care – PPO | Admitting: Cardiovascular Disease

## 2014-12-04 ENCOUNTER — Ambulatory Visit (INDEPENDENT_AMBULATORY_CARE_PROVIDER_SITE_OTHER): Payer: 59 | Admitting: Physician Assistant

## 2014-12-04 VITALS — BP 110/68 | HR 88 | Temp 98.1°F | Resp 16 | Ht 69.0 in | Wt 145.0 lb

## 2014-12-04 DIAGNOSIS — J069 Acute upper respiratory infection, unspecified: Secondary | ICD-10-CM

## 2014-12-04 DIAGNOSIS — B9789 Other viral agents as the cause of diseases classified elsewhere: Principal | ICD-10-CM

## 2014-12-04 MED ORDER — BENZONATATE 100 MG PO CAPS: 100.0000 mg | ORAL_CAPSULE | Freq: Three times a day (TID) | ORAL | Status: DC | PRN

## 2014-12-04 MED ORDER — HYDROCOD POLST-CHLORPHEN POLST 10-8 MG/5ML PO LQCR: 5.0000 mL | Freq: Two times a day (BID) | ORAL | Status: DC | PRN

## 2014-12-04 NOTE — Progress Notes (Signed)
Subjective:    Patient ID: Sonya Hunt, female    DOB: 1960/12/30, 54 y.o.   MRN: [id number]  HPI  This is a 54 year old female presenting with URI symptoms x 6 days. Symptoms are nasal congestion, sore throat and cough. Sore throat has completely resolved. Nasal congestion is improving. Cough is mostly dry and is her most bothersome symptom. States she is unable to sleep at night and her "husband is threatening to leave the bed". Denies otalgia, fever, chills, SOB, wheezing. No history of lung disease and she is not a smoker. Trying cough drops and advil for symptoms and helping some. States she will occasionally take benadryl for allergic symptoms (sneezing) - not having allergic symptoms at this time.   Review of Systems  Constitutional: Negative for fever and chills.  HENT: Positive for congestion and sore throat. Negative for ear pain and sinus pressure.   Eyes: Negative for redness.  Respiratory: Positive for cough. Negative for shortness of breath and wheezing.   Gastrointestinal: Negative for nausea, vomiting and diarrhea.  Skin: Negative for rash.  Allergic/Immunologic: Positive for environmental allergies.  Hematological: Negative for adenopathy.  Psychiatric/Behavioral: Positive for sleep disturbance.    Patient Active Problem List   Diagnosis Date Noted  . Paresthesias 06/09/2014  . Tachycardia 05/15/2014   Prior to Admission medications   Medication Sig Start Date End Date Taking? Authorizing Provider  gabapentin (NEURONTIN) 300 MG capsule Take 1 capsule (300 mg total) by mouth at bedtime. Patient not taking: Reported on 12/04/2014 06/09/14  prn Melvenia Beam, MD   No Known Allergies  Patient's social and family history were reviewed.     Objective:   Physical Exam  Constitutional: She is oriented to person, place, and time. She appears well-developed and well-nourished. No distress.  HENT:  Head: Normocephalic and atraumatic.  Right Ear: Hearing, tympanic  membrane, external ear and ear canal normal.  Left Ear: Hearing, tympanic membrane, external ear and ear canal normal.  Nose: Nose normal. Right sinus exhibits no maxillary sinus tenderness and no frontal sinus tenderness. Left sinus exhibits no maxillary sinus tenderness and no frontal sinus tenderness.  Mouth/Throat: Uvula is midline and mucous membranes are normal. Posterior oropharyngeal erythema present. No oropharyngeal exudate or posterior oropharyngeal edema.  Eyes: Conjunctivae and lids are normal. Right eye exhibits no discharge. Left eye exhibits no discharge. No scleral icterus.  Cardiovascular: Normal rate, regular rhythm, normal heart sounds, intact distal pulses and normal pulses.   No murmur heard. Pulmonary/Chest: Effort normal and breath sounds normal. No respiratory distress. She has no wheezes. She has no rhonchi. She has no rales.  Musculoskeletal: Normal range of motion.  Lymphadenopathy:       Head (right side): No submental, no submandibular and no tonsillar adenopathy present.       Head (left side): No submental, no submandibular and no tonsillar adenopathy present.    She has no cervical adenopathy.  Neurological: She is alert and oriented to person, place, and time.  Skin: Skin is warm, dry and intact. No lesion and no rash noted.  Psychiatric: She has a normal mood and affect. Her speech is normal and behavior is normal. Thought content normal.   BP 110/68 mmHg  Pulse 88  Temp(Src) 98.1 F (36.7 C) (Oral)  Resp 16  Ht 5\' 9"  (1.753 m)  Wt 145 lb (65.772 kg)  BMI 21.40 kg/m2  SpO2 100%     Assessment & Plan:  1. Viral URI with cough  Pt's symptoms are improving, except for a lingering dry cough. She will use tessalon and tussionex for symptom control. She will return in 1 week if symptoms do not continue to improve.  - benzonatate (TESSALON) 100 MG capsule; Take 1-2 capsules (100-200 mg total) by mouth 3 (three) times daily as needed for cough.  Dispense: 40  capsule; Refill: 0 - chlorpheniramine-HYDROcodone (TUSSIONEX PENNKINETIC ER) 10-8 MG/5ML LQCR; Take 5 mLs by mouth every 12 (twelve) hours as needed for cough (cough).  Dispense: 80 mL; Refill: 0   Gorman Safi V. Drenda Freeze, MHS [facility]  12/04/2014

## 2014-12-04 NOTE — Patient Instructions (Signed)
Take tessalon during the day for cough. Take cough syrup at night to help you sleep. Return if symptoms do not continue to improve in 1 week.

## 2015-06-26 ENCOUNTER — Ambulatory Visit (INDEPENDENT_AMBULATORY_CARE_PROVIDER_SITE_OTHER): Payer: 59 | Admitting: Family Medicine

## 2015-06-26 VITALS — BP 130/78 | HR 78 | Temp 98.2°F | Resp 16 | Ht 69.0 in | Wt 142.0 lb

## 2015-06-26 DIAGNOSIS — B86 Scabies: Secondary | ICD-10-CM

## 2015-06-26 DIAGNOSIS — Z7189 Other specified counseling: Secondary | ICD-10-CM | POA: Diagnosis not present

## 2015-06-26 DIAGNOSIS — Z7184 Encounter for health counseling related to travel: Secondary | ICD-10-CM

## 2015-06-26 MED ORDER — ALPRAZOLAM 0.25 MG PO TABS: 0.2500 mg | ORAL_TABLET | Freq: Two times a day (BID) | ORAL | Status: DC | PRN

## 2015-06-26 MED ORDER — IVERMECTIN 3 MG PO TABS: 12.0000 mg | ORAL_TABLET | Freq: Once | ORAL | Status: DC

## 2015-06-26 NOTE — Progress Notes (Signed)
This chart was scribed for Sonya Haber, MD by Moises Blood, medical scribe at [facility].The patient was seen in exam room 3 and the patient's care was started at 2:36 PM.  Patient ID: Sonya Hunt MRN: [id number], DOB: 07-14-1961, 54 y.o. Date of Encounter: 06/26/2015  Primary Physician: Osborne Casco, MD  Chief Complaint:  Chief Complaint  Patient presents with  . Rash    on right arm x 5 days  . pt has to fly next week    would like something to help with anxiety    HPI:  Sonya Hunt is a 54 y.o. female who presents to [facility] complaining of a rash noticed 5 days ago.  She noticed it on her right forearm and some on her left hand. She's in and out of the yard a lot.   She also has to fly out of the country next week and is anxious about it.  She's traveling to [place]; her husband is going on a business trip and she's tagging along for vacation.   Past Medical History  Diagnosis Date  . Basal cell carcinoma      Home Meds: Prior to Admission medications   Not on File    Allergies: No Known Allergies  Social History   Social History  . Marital Status: Married    Spouse Name: [name]   . Number of Children: 3  . Years of Education: 12+   Occupational History  . enrollment    Social History Main Topics  . Smoking status: Never Smoker   . Smokeless tobacco: Never Used  . Alcohol Use: No  . Drug Use: No  . Sexual Activity: Not on file   Other Topics Concern  . Not on file   Social History Narrative   Patient lives at home with husband [name].    Patient has 3 children.    Patient has a college education.    Patient is right handed.    Patient is currently employed at [employer].      Review of Systems: Constitutional: negative for chills, fever, night sweats, weight changes, or fatigue  HEENT: negative for vision changes, hearing loss, congestion, rhinorrhea, ST, epistaxis,  or sinus pressure Cardiovascular: negative for chest pain or palpitations Respiratory: negative for hemoptysis, wheezing, shortness of breath, or cough Abdominal: negative for abdominal pain, nausea, vomiting, diarrhea, or constipation Dermatological: positive for rash (right forearm) Neurologic: negative for headache, dizziness, or syncope All other systems reviewed and are otherwise negative with the exception to those above and in the HPI.  Physical Exam: Blood pressure 130/78, pulse 78, temperature 98.2 F (36.8 C), temperature source Oral, resp. rate 16, height 5\' 9"  (1.753 m), weight 142 lb (64.411 kg), SpO2 99 %., Body mass index is 20.96 kg/(m^2). General: Well developed, well nourished, in no acute distress. Head: Normocephalic, atraumatic, eyes without discharge, sclera non-icteric, nares are without discharge. Bilateral auditory canals clear, TM's are without perforation, pearly grey and translucent with reflective cone of light bilaterally. Oral cavity moist, posterior pharynx without exudate, erythema, peritonsillar abscess, or post nasal drip.  Neck: Supple. No thyromegaly. Full ROM. No lymphadenopathy. Lungs: Clear bilaterally to auscultation without wheezes, rales, or rhonchi. Breathing is unlabored. Heart: RRR with S1 S2. No murmurs, rubs, or gallops appreciated. Abdomen: Soft, non-tender, non-distended with normoactive bowel sounds. No hepatomegaly. No rebound/guarding. No obvious abdominal masses. Msk:  Strength and tone normal for age. Extremities/Skin: Warm and  dry. No clubbing or cyanosis. No edema. Small bumps on right forearm and between her fingers in linear distribution with small patch on ulnar aspect of left hand and vesicles on dorsal right thumb Neuro: Alert and oriented X 3. Moves all extremities spontaneously. Gait is normal. CNII-XII grossly in tact. Psych:  Responds to questions appropriately with a normal affect.     ASSESSMENT AND PLAN:  54 y.o. year old  female with travel anxiety and scabies  This chart was scribed in my presence and reviewed by me personally.    ICD-9-CM ICD-10-CM   1. Scabies 133.0 B86 ivermectin (STROMECTOL) 3 MG TABS tablet  2. Travel advice encounter V65.49 Z71.89 ALPRAZolam Duanne Moron) 0.25 MG tablet     Signed, Sonya Haber, MD    By signing my name below, I, Moises Blood, attest that this documentation has been prepared under the direction and in the presence of Sonya Haber, MD. Electronically Signed: Moises Blood, [place]. 06/26/2015 , 2:36 PM .  Signed, Sonya Haber, MD 06/26/2015 2:36 PM

## 2015-06-26 NOTE — Patient Instructions (Signed)
[facility], [place]

## 2015-06-29 ENCOUNTER — Telehealth: Payer: Self-pay

## 2015-06-29 NOTE — Telephone Encounter (Signed)
States that she finished her treatment and does not have any new rashes or itchy areas. States that is going to [place] for 1 week and wants to make sure she is covered. Advised patient that typically re-treatment is in 2 weeks so if has any problems will send second dose at that time. Patient agrees and is ok with just calling back.

## 2015-06-29 NOTE — Telephone Encounter (Signed)
Wanting to know if she can take ivermectin (STROMECTOL) 3 MG TABS tablet To take in seven days.   She has new spots and would like to treat as she read on the internet   [phone]

## 2015-07-13 ENCOUNTER — Other Ambulatory Visit: Payer: Self-pay | Admitting: Family Medicine

## 2015-07-15 ENCOUNTER — Other Ambulatory Visit: Payer: Self-pay | Admitting: Neurology

## 2015-07-27 ENCOUNTER — Telehealth: Payer: Self-pay | Admitting: Neurology

## 2015-07-27 MED ORDER — GABAPENTIN 300 MG PO CAPS: 300.0000 mg | ORAL_CAPSULE | Freq: Every day | ORAL | Status: DC

## 2015-07-27 NOTE — Telephone Encounter (Signed)
Rx has been sent to last until appt.  Receipt confirmed by pharmacy.  Called back to advise, got no answer.  Left message.

## 2015-07-27 NOTE — Telephone Encounter (Signed)
Pt called to have refill on gabapentin (NEURONTIN) 300 MG capsule but needed appt first. She has appt Dec. 6th but will need refill until then. May call [phone]

## 2015-09-01 ENCOUNTER — Ambulatory Visit: Payer: 59 | Admitting: Neurology

## 2015-09-14 ENCOUNTER — Encounter: Payer: Self-pay | Admitting: Neurology

## 2015-09-14 ENCOUNTER — Ambulatory Visit (INDEPENDENT_AMBULATORY_CARE_PROVIDER_SITE_OTHER): Payer: Managed Care, Other (non HMO) | Admitting: Neurology

## 2015-09-14 VITALS — BP 152/81 | HR 83 | Ht 69.0 in | Wt 143.0 lb

## 2015-09-14 DIAGNOSIS — R202 Paresthesia of skin: Secondary | ICD-10-CM | POA: Diagnosis not present

## 2015-09-14 DIAGNOSIS — G629 Polyneuropathy, unspecified: Secondary | ICD-10-CM

## 2015-09-14 DIAGNOSIS — E538 Deficiency of other specified B group vitamins: Secondary | ICD-10-CM

## 2015-09-14 MED ORDER — GABAPENTIN 100 MG PO CAPS: 100.0000 mg | ORAL_CAPSULE | Freq: Three times a day (TID) | ORAL | Status: DC

## 2015-09-14 MED ORDER — PREGABALIN 50 MG PO CAPS: 50.0000 mg | ORAL_CAPSULE | Freq: Two times a day (BID) | ORAL | Status: DC

## 2015-09-14 NOTE — Progress Notes (Signed)
[facility]    Provider:  Dr Jaynee Eagles Referring Provider: Kelton Pillar, MD Primary Care Physician:  Osborne Casco, MD  CC: Paresthesias  Interval history: she started taking B12 and her symptoms appeared to be improved. She still has some tingling. She still ahs paresthesias in the fingers and the toes. Discussed B12 deficiency in detail, she takes 1065mcg a day. She can increase it to 2085mcg daily. Will perform emg/ncs to assess for any other cause of paresthjesias.  HPI: Sonya Hunt is a 54 y.o. female here as a referral from Dr. Laurann Montana for Paresthesias. The week after mother's day she felt numbness, tingling in all the limbs. Started in the right arm, like her arm was asleep. Also numbness and tingly in the lips. Started slowly and progressed. Got significantly worse in May. Went away for a week with just some paresthesias at night. Is waxing and waning. At it's height in July around the 18th she had a week of the symptoms and it was getting very intense and her heart started beating really fast, they went to the emergency room and she was tachycardic. Paresthesias went up to 8/10 in discomfort. They go away for a few weeks and then come back. Symptoms always start slowly and ascend with a strange sensation of paresthesias and numbness, always starting in the right arm. No neck pain, no back pain, no weakness. She has warm sensations, maybe entering menopause, b which tends to happen at the same time as the paresthesias. No diabetes. She works out regularly, runs. Went ot the cardiologist and EKG was normal. Right arm always more intense. No other focal symptoms, denies weakness, vision changes, speech or swallow problems. She has an overactive bladder. Nothing seems to make symptoms worse or trigger them, better if walking around or moving. No SOB. Sometimes when sitting for long periods has the urge to move legs and also gets the same sensations in bed, like she  has to move legs, as in restless leg syndrome. Not sleeping well, wakes every hour or two. Not snoring, no apneic episodes. Partner doesn't notice abnormal movements. No recent depression or stress. Life has been less stressful since son moved out of house and she feels good. No cramping. Balance is fine. No dizzyness. Brain feels "fuzzy" at times. No recent illness or preceding illness. Some cognitive "fuzziness" for a few months. No headaches. Today her lips are numb symmetrically around the lips.  She currently has the paresthesias, started 8 days ago. Previous to this this she had gone 21 days without symptoms, had a small bout the beginning of august. Describes symptoms of pins and needles up the whole arm up to shoulder on right, left arm in the fingers, legs to the knees, and around the lips.     Reviewed notes, labs and imaging from outside physicians, which showed recent CBC unremarkable, BMP with K 3.4. TSH 2.08.   Review of Systems: Patient complains of symptoms per HPI as well as the following symptoms: Numbness, speech difficulty, restless leg, palpitations. Pertinent negatives per HPI. All others negative.   Social History   Social History  . Marital Status: Married    Spouse Name: Sonya Hunt   . Number of Children: 3  . Years of Education: 12+   Occupational History  . enrollment    Social History Main Topics  . Smoking status: Never Smoker   . Smokeless tobacco: Never Used  . Alcohol Use: No  . Drug Use: No  . Sexual  Activity: Not on file   Other Topics Concern  . Not on file   Social History Narrative   Patient lives at home with husband Sonya Hunt.    Patient has 3 children.    Patient has a college education.    Patient is right handed.    Patient is currently employed at [employer].     Family History  Problem Relation Age of Onset  . Heart disease Mother   . Hypertension Mother   . Prostate cancer Father   . Hypertension Brother     Past  Medical History  Diagnosis Date  . Basal cell carcinoma     Past Surgical History  Procedure Laterality Date  . Appendectomy  1992  . Excision of bcc r arm      Current Outpatient Prescriptions  Medication Sig Dispense Refill  . gabapentin (NEURONTIN) 300 MG capsule Take 1 capsule (300 mg total) by mouth at bedtime. 30 capsule 1   No current facility-administered medications for this visit.    Allergies as of 09/14/2015  . (No Known Allergies)    Vitals: BP 152/81 mmHg  Pulse 83  Ht 5\' 9"  (1.753 m)  Wt 143 lb (64.864 kg)  BMI 21.11 kg/m2 Last Weight:  Wt Readings from Last 1 Encounters:  09/14/15 143 lb (64.864 kg)   Last Height:   Ht Readings from Last 1 Encounters:  09/14/15 5\' 9"  (1.753 m)    Cranial Nerves:  The pupils are equal, round, and reactive to light. The fundi are normal and spontaneous venous pulsations are present. Visual fields are full to finger confrontation. Extraocular movements are intact. Trigeminal sensation is intact and the muscles of mastication are normal. The face is symmetric. The palate elevates in the midline. Voice is normal. Shoulder shrug is normal. The tongue has normal motion without fasciculations.   Coordination:  Normal finger to nose and heel to shin. Normal rapid alternating movements.   Gait:  Heel-toe and tandem gait are normal.   Motor Observation:  No asymmetry, no atrophy, and no involuntary movements noted. Tone:  Normal muscle tone.   Posture:  Posture is normal. normal erect   Strength:  Strength is V/V in the upper and lower limbs.     Vibratory Sensation:  Normal vibratory sensation in upper and lower extremities.   Light Touch:  Normal light touch sensation in upper and lower extremities.     Proprioception:  Normal proprioception in upper and lower extremities.  Pin Prick:  Normal sensation to pinprick in upper and lower extremities.    Temperature:  Normal  temperature sensation in upper and lower extremities.  Reflex Exam:  DTR's:  Deep tendon reflexes in the upper and lower extremities are normal bilaterally.  Toes:  The toes are downgoing bilaterally.  Clonus:  Clonus is absent.      Assessment/Plan: 54 year old female here with waxing and waning paresthesias in the extremities. Symptoms start in the right arm then progress to include all extremities, resolve for a few weeks then repeat again. Will persist for a week or 2 and then reoccur in the same pattern. She also feels a buzzing like a cell phone on her. Her lips feel tingly, in a circular pattern around the mouth. She is having episodic feelings of heat that correspond to the paresthesias and thinks possibly it is menopause related. No other focal neurologic deficits. Neuro exam is normal. She has had episodes of tachycardia and has been evaluated by  cardiology.   Neuropathy screen revealed B12 deficiency of 168. B12 supplementation has improved her symptoms. I'll recheck B12 today and she can increase her T12-2 thousand micrograms daily. EMG nerve conduction study for evaluation of other causes of paresthesias   Sarina Ill, MD  [facility] [address] [place], [place] [address]  Phone [phone] Fax [phone]  Phone [phone] Fax [phone]  A total of 53minutes was spent face-to-face with this patient. Over half this time was spent on counseling patient on the b12 deficiency and neuropathy diagnosis and different diagnostic and therapeutic options available.

## 2015-09-14 NOTE — Patient Instructions (Signed)
Overall you are doing fairly well but I do want to suggest a few things today:   Remember to drink plenty of fluid, eat healthy meals and do not skip any meals. Try to eat protein with a every meal and eat a healthy snack such as fruit or nuts in between meals. Try to keep a regular sleep-wake schedule and try to exercise daily, particularly in the form of walking, 20-30 minutes a day, if you can.   As far as your medications are concerned, I would like to suggest: Lyrica 50mg  twice daily.   As far as diagnostic testing: emg/ncs  I would like to see you back for emg/ncs, sooner if we need to. Please call us with any interim questions, concerns, problems, updates or refill requests.   Please also call us for any test results so we can go over those with you on the phone.  My clinical assistant and will answer any of your questions and relay your messages to me and also relay most of my messages to you.   Our phone number is [phone]. We also have an after hours call service for urgent matters and there is a physician on-call for urgent questions. For any emergencies you know to call 911 or go to the nearest emergency room

## 2015-09-15 ENCOUNTER — Telehealth: Payer: Self-pay | Admitting: *Deleted

## 2015-09-15 NOTE — Telephone Encounter (Signed)
Called and spoke to pt about results per Dr Jaynee Eagles. Pt verbalized understanding. She did increase dose yesterday.

## 2015-09-15 NOTE — Telephone Encounter (Signed)
-----   Message from Melvenia Beam, MD sent at 09/15/2015 12:20 PM EST ----- Vitamin B12 looks excellent! It won' t hurt to increase the B12 oral daily as we discussed but her B12 look so good I don't think she needs it, however it wont hurt at all.

## 2015-09-17 LAB — METHYLMALONIC ACID, SERUM: Methylmalonic Acid: 186 nmol/L (ref 0–378)

## 2015-09-17 LAB — B12 AND FOLATE PANEL
Folate: 12.7 ng/mL (ref >3.0)
Vitamin B-12: 1263 pg/mL — ABNORMAL HIGH (ref 211–946)

## 2015-10-08 ENCOUNTER — Ambulatory Visit (INDEPENDENT_AMBULATORY_CARE_PROVIDER_SITE_OTHER): Payer: Managed Care, Other (non HMO) | Admitting: Neurology

## 2015-10-08 ENCOUNTER — Ambulatory Visit (INDEPENDENT_AMBULATORY_CARE_PROVIDER_SITE_OTHER): Payer: Self-pay | Admitting: Neurology

## 2015-10-08 DIAGNOSIS — Z0289 Encounter for other administrative examinations: Secondary | ICD-10-CM

## 2015-10-08 DIAGNOSIS — R202 Paresthesia of skin: Secondary | ICD-10-CM

## 2015-10-08 DIAGNOSIS — G35 Multiple sclerosis: Secondary | ICD-10-CM

## 2015-10-09 ENCOUNTER — Telehealth: Payer: Self-pay

## 2015-10-09 LAB — BASIC METABOLIC PANEL
BUN/Creatinine Ratio: 13 (ref 9–23)
BUN: 8 mg/dL (ref 6–24)
CALCIUM: 9.1 mg/dL (ref 8.7–10.2)
CO2: 23 mmol/L (ref 18–29)
CREATININE: 0.61 mg/dL (ref 0.57–1.00)
Chloride: 100 mmol/L (ref 96–106)
GFR calc Af Amer: 119 mL/min/{1.73_m2} (ref >59)
GFR, EST NON AFRICAN AMERICAN: 103 mL/min/{1.73_m2} (ref >59)
Glucose: 90 mg/dL (ref 65–99)
Potassium: 4 mmol/L (ref 3.5–5.2)
SODIUM: 139 mmol/L (ref 134–144)

## 2015-10-09 NOTE — Telephone Encounter (Signed)
-----   Message from Melvenia Beam, MD sent at 10/09/2015 12:46 PM EST ----- Labs normal thanks

## 2015-10-09 NOTE — Telephone Encounter (Signed)
Patient returned Kristen's call, patient advised labs were normal.

## 2015-10-09 NOTE — Telephone Encounter (Signed)
Called to advise pt that her labs were normal. Her VM is full, unable to leave a message.

## 2015-10-10 NOTE — Progress Notes (Signed)
  [facility]    Provider:  Dr Jaynee Eagles Referring Provider: Kelton Pillar, MD Primary Care Physician:  Osborne Casco, MD  HPI:  Sonya Hunt is a 55 y.o. female here for evaluation of numbness and tingling in all the limbs. PMHx of B12 deficiency; improvement of symptoms with oral supplementation.   Summary  Nerve conduction studies were performed on the bilateral upper and lower extremities:  The bilateral Median motor nerves showed normal conductions with normal F Wave latency The bilateral Ulnar motor nerves showed normal conductions with normal F Wave latency The bilateral Peroneal motor nerves showed normal conductions with normal F Wave latency The bilateral Tibial motor nerves showed normal conductions with normal F Wave latency The bilateral second-digit Median sensory nerves were within normal limits The bilateral fifth-digit Ulnar sensory nerves were within normal limits The bilateral Sural sensory nerves were within normal limits Bilateral H Reflexes showed normal latencies  EMG Needle study was performed on selected muscles:   The right Deltoid, right Triceps, right Pronator Teres, right Opponens Pollicis, right First Dorsal interosseous, right Vastus Medialis, right Anterior Tibialis, right Medial Gastrocnemius, right Extensor Hallucis Longus, right Extensor Hallucis muscles were within normal limits.   Conclusion: This is a normal study. No electrophysiologic evidence for ulnar or median neuropathy, peripheral polyneuropathy, cervical or lumbar radiculopathy, muscle disorder. However a small-fiber neuropathy can be the cause of patient's symptoms and evade detection by this exam.    Sarina Ill, MD  [facility] [address] [place],  [address]  Phone [phone] Fax [phone]

## 2015-10-10 NOTE — Progress Notes (Signed)
See procedure note.

## 2015-10-10 NOTE — Procedures (Signed)
  [facility]    Provider:  Dr Jaynee Eagles Referring Provider: Kelton Pillar, MD Primary Care Physician:  Osborne Casco, MD  HPI:  Sonya Hunt is a 55 y.o. female here for evaluation of numbness and tingling in all the limbs. PMHx of B12 deficiency; improvement of symptoms with oral supplementation.   Summary  Nerve conduction studies were performed on the bilateral upper and lower extremities:  The bilateral Median motor nerves showed normal conductions with normal F Wave latency The bilateral Ulnar motor nerves showed normal conductions with normal F Wave latency The bilateral Peroneal motor nerves showed normal conductions with normal F Wave latency The bilateral Tibial motor nerves showed normal conductions with normal F Wave latency The bilateral second-digit Median sensory nerves were within normal limits The bilateral fifth-digit Ulnar sensory nerves were within normal limits The bilateral Sural sensory nerves were within normal limits Bilateral H Reflexes showed normal latencies  EMG Needle study was performed on selected muscles:   The right Deltoid, right Triceps, right Pronator Teres, right Opponens Pollicis, right First Dorsal interosseous, right Vastus Medialis, right Anterior Tibialis, right Medial Gastrocnemius, right Extensor Hallucis Longus, right Extensor Hallucis muscles were within normal limits.   Conclusion: This is a normal study. No electrophysiologic evidence for ulnar or median neuropathy, peripheral polyneuropathy, cervical or lumbar radiculopathy, muscle disorder. However a small-fiber neuropathy can be the cause of patient's symptoms and evade detection by this exam.    Sarina Ill, MD  [facility] [address] [place], [place] [address]  Phone [phone] Fax [phone]

## 2015-10-14 ENCOUNTER — Telehealth: Payer: Self-pay | Admitting: *Deleted

## 2015-10-14 NOTE — Telephone Encounter (Signed)
Error

## 2015-11-09 ENCOUNTER — Other Ambulatory Visit: Payer: Self-pay | Admitting: Neurology

## 2015-11-09 NOTE — Telephone Encounter (Signed)
LVM for pt to call back to verify how she is taking gabapentin. Gave [facility] phone number for her to call back.

## 2015-11-10 NOTE — Telephone Encounter (Signed)
[place]eno call from [facility]. Mailbox full, could not leave VM.

## 2015-11-10 NOTE — Telephone Encounter (Signed)
Patient returned your call in regard to how she is taking gabapentin. Please call her as she was having some difficulty explaining.  Thanks!

## 2015-11-10 NOTE — Telephone Encounter (Signed)
Spoke to [employer]. She has gabapentin left that she has been finishing up. She will then bring in printed rx lyrica from Dr Jaynee Eagles and fill this. She will d/c gabapentin since she will be starting lyrica. Dr Jaynee Eagles aware and okay with this. Told pt to call if she has any further questions. I am going to deny refill request. She verbalized understanding.

## 2016-02-11 ENCOUNTER — Ambulatory Visit (INDEPENDENT_AMBULATORY_CARE_PROVIDER_SITE_OTHER): Payer: Managed Care, Other (non HMO) | Admitting: Family Medicine

## 2016-02-11 VITALS — BP 122/80 | HR 80 | Temp 98.1°F | Resp 18 | Ht 69.0 in | Wt 144.0 lb

## 2016-02-11 DIAGNOSIS — R21 Rash and other nonspecific skin eruption: Secondary | ICD-10-CM | POA: Diagnosis not present

## 2016-02-11 MED ORDER — IVERMECTIN 3 MG PO TABS: 150.0000 ug/kg | ORAL_TABLET | Freq: Once | ORAL | Status: DC

## 2016-02-11 MED ORDER — PERMETHRIN 5 % EX CREA: 1.0000 "application " | TOPICAL_CREAM | Freq: Once | CUTANEOUS | Status: DC

## 2016-02-11 NOTE — Progress Notes (Signed)
   Subjective:    Patient ID: Sonya Hunt, female    DOB: 18-Aug-1961, 55 y.o.   MRN: [id number]  HPI This is a 55 yo female who presents today with rash on her left knee. She was seen 9/16 with a similar rash that was also on her forearm and was told it was scabies and was treated with ivermectin. The bumps appeared about a week ago and itch intensely. She has not traveled recently. Husband not affected. She states that she thinks she gets these from gardening which she does every day.   Past Medical History  Diagnosis Date  . Basal cell carcinoma    Past Surgical History  Procedure Laterality Date  . Appendectomy  1992  . Excision of bcc r arm     Family History  Problem Relation Age of Onset  . Heart disease Mother   . Hypertension Mother   . Prostate cancer Father   . Hypertension Brother    Social History  Substance Use Topics  . Smoking status: Never Smoker   . Smokeless tobacco: Never Used  . Alcohol Use: No    Review of Systems No fever or chills    Objective:   Physical Exam  Constitutional: She appears well-developed and well-nourished.  HENT:  Head: Normocephalic and atraumatic.  Eyes: Conjunctivae are normal.  Neck: Normal range of motion. Neck supple.  Cardiovascular: Normal rate.   Skin:  Right knee with approximately 5 small, erythematous papules in a linear formation. Slightly distal to this is a singular lesion that is slightly larger (approximately 5 mm).   Vitals reviewed.  BP 122/80 mmHg  Pulse 80  Temp(Src) 98.1 F (36.7 C) (Oral)  Resp 18  Ht 5\' 9"  (1.753 m)  Wt 144 lb (65.318 kg)  BMI 21.26 kg/m2  SpO2 100%     Assessment & Plan:  1. Rash and nonspecific skin eruption - discussed diagnosis and treatment with patient. Patient without risk factors for scabies infection. She is willing to try topical treatment. I have sent in a prescription for topical treatment and gave her a printed prescription for oral treatment if topical fails.  Discussed potential side effects of ivermectin.  - permethrin (ACTICIN) 5 % cream; Apply 1 application topically once.  Dispense: 60 g; Refill: 0 - ivermectin (STROMECTOL) 3 MG TABS tablet; Take 3.5 tablets (10,500 mcg total) by mouth once.  Dispense: 4 tablet; Refill: 1 - RTC precautions reviewed.  Clarene Reamer, FNP-BC  [facility]  02/11/2016 3:53 PM

## 2016-02-11 NOTE — Patient Instructions (Addendum)
Apply cream and leave on for 8 hours  If not effective in 3-4 days, you can fill tablets.       IF you received an x-ray today, you will receive an invoice from [facility]. Please contact [facility] at [phone] with questions or concerns regarding your invoice.   IF you received labwork today, you will receive an invoice from [employer]. Please contact [facility] at [phone] with questions or concerns regarding your invoice.   Our billing staff will not be able to assist you with questions regarding bills from these companies.  You will be contacted with the lab results as soon as they are available. The fastest way to get your results is to activate your My Chart account. Instructions are located on the last page of this paperwork. If you have not heard from [place] regarding the results in 2 weeks, please contact this office.

## 2016-07-07 ENCOUNTER — Other Ambulatory Visit: Payer: Self-pay | Admitting: Neurology

## 2016-07-07 ENCOUNTER — Encounter: Payer: Self-pay | Admitting: *Deleted

## 2016-07-07 ENCOUNTER — Telehealth: Payer: Self-pay | Admitting: Neurology

## 2016-07-07 MED ORDER — PREGABALIN 50 MG PO CAPS: 50.0000 mg | ORAL_CAPSULE | Freq: Two times a day (BID) | ORAL | 5 refills | Status: DC

## 2016-07-07 NOTE — Telephone Encounter (Signed)
Dr Jaynee Eagles- can you call in new rx? Pt does have a f/u scheduled for 09/29/16, thank you

## 2016-07-07 NOTE — Telephone Encounter (Signed)
Pt request refill for Lyrica sent to [facility]. She said she has been taking samples 1 tab every other day and she will run out on Monday.

## 2016-07-07 NOTE — Progress Notes (Signed)
Faxed printed rx lyrica to patient pharmacy signed by [email or web address]. Fax: [phone]. Received confirmation.

## 2016-09-29 ENCOUNTER — Ambulatory Visit: Payer: Managed Care, Other (non HMO) | Admitting: Neurology

## 2016-11-02 ENCOUNTER — Ambulatory Visit: Payer: Managed Care, Other (non HMO) | Admitting: Neurology

## 2016-12-06 ENCOUNTER — Encounter (INDEPENDENT_AMBULATORY_CARE_PROVIDER_SITE_OTHER): Payer: Self-pay

## 2016-12-06 ENCOUNTER — Ambulatory Visit (INDEPENDENT_AMBULATORY_CARE_PROVIDER_SITE_OTHER): Payer: Managed Care, Other (non HMO) | Admitting: Neurology

## 2016-12-06 ENCOUNTER — Encounter: Payer: Self-pay | Admitting: Neurology

## 2016-12-06 VITALS — BP 116/72 | HR 74 | Ht 69.0 in | Wt 147.4 lb

## 2016-12-06 DIAGNOSIS — G629 Polyneuropathy, unspecified: Secondary | ICD-10-CM | POA: Diagnosis not present

## 2016-12-06 DIAGNOSIS — E538 Deficiency of other specified B group vitamins: Secondary | ICD-10-CM | POA: Diagnosis not present

## 2016-12-06 NOTE — Progress Notes (Signed)
[id number]    Provider:  Dr Sonya Hunt Referring Provider: Kelton Pillar, MD Primary Care Physician:  Sonya Casco, MD  CC: Paresthesias  Interval history: B12 was 168 2 years ago. Her paresthesias are improved on B12 supplementation. Discussed differential neuropathy. She still does have paresthesias but they are improved. We checked multiple labs and B12 deficiency was the only predisposing factors for neuropathy. However there are several more we can try including Sjogren's which also can cause pure sensory neuropathy. Her rheumatoid factor was minimally elevated in the past so we can recheck with CCP antibodies. Also we can check vitamin B1 space, heavy metals, hemoglobin A1c and repeat B12 and folate.  Interval history: she started taking B12 and her symptoms appeared to be improved. She still has some tingling. She still ahs paresthesias in the fingers and the toes. Discussed B12 deficiency in detail, she takes 1021mcg a day. She can increase it to 2062mcg daily. Will perform emg/ncs to assess for any other cause of paresthjesias.  HPI: Sonya Hunt is a 56 y.o. female here as a referral from Dr. Laurann Hunt for Paresthesias. The week after mother's day she felt numbness, tingling in all the limbs. Started in the right arm, like her arm was asleep. Also numbness and tingly in the lips. Started slowly and progressed. Got significantly worse in May. Went away for a week with just some paresthesias at night. Is waxing and waning. At it's height in July around the 18th she had a week of the symptoms and it was getting very intense and her heart started beating really fast, they went to the emergency room and she was tachycardic. Paresthesias went up to 8/10 in discomfort. They go away for a few weeks and then come back. Symptoms always start slowly and ascend with a strange sensation of paresthesias and numbness, always starting in the right arm. No neck pain, no back  pain, no weakness. She has warm sensations, maybe entering menopause, b which tends to happen at the same time as the paresthesias. No diabetes. She works out regularly, runs. Went ot the cardiologist and EKG was normal. Right arm always more intense. No other focal symptoms, denies weakness, vision changes, speech or swallow problems. She has an overactive bladder. Nothing seems to make symptoms worse or trigger them, better if walking around or moving. No SOB. Sometimes when sitting for long periods has the urge to move legs and also gets the same sensations in bed, like she has to move legs, as in restless leg syndrome. Not sleeping well, wakes every hour or two. Not snoring, no apneic episodes. Partner doesn't notice abnormal movements. No recent depression or stress. Life has been less stressful since son moved out of house and she feels good. No cramping. Balance is fine. No dizzyness. Brain feels "fuzzy" at times. No recent illness or preceding illness. Some cognitive "fuzziness" for a few months. No headaches. Today her lips are numb symmetrically around the lips.  She currently has the paresthesias, started 8 days ago. Previous to this this she had gone 21 days without symptoms, had a small bout the beginning of august. Describes symptoms of pins and needles up the whole arm up to shoulder on right, left arm in the fingers, legs to the knees, and around the lips.     Reviewed notes, labs and imaging from outside physicians, which showed recent CBC unremarkable, BMP with K 3.4. TSH 2.08.   Review of Systems: Patient complains of symptoms per HPI  as well as the following symptoms: Numbness, speech difficulty, restless leg, palpitations. Pertinent negatives per HPI. All others negative.   Social History   Social History  . Marital status: Married    Spouse name: Sonya Hunt   . Number of children: 3  . Years of education: 12+   Occupational History  . enrollment [employer]    Social History Main Topics  . Smoking status: Never Smoker  . Smokeless tobacco: Never Used  . Alcohol use No  . Drug use: No  . Sexual activity: Not on file   Other Topics Concern  . Not on file   Social History Narrative   Patient lives at home with husband Sonya Hunt.    Patient has 3 children.    Patient has a college education.    Patient is right handed.    Patient is currently employed at [employer].     Family History  Problem Relation Age of Onset  . Heart disease Mother   . Hypertension Mother   . Prostate cancer Father   . Hypertension Brother     Past Medical History:  Diagnosis Date  . Basal cell carcinoma     Past Surgical History:  Procedure Laterality Date  . APPENDECTOMY  1992  . Excision of BCC R arm      Current Outpatient Prescriptions  Medication Sig Dispense Refill  . cyanocobalamin 1000 MCG tablet Take 1,000 mcg by mouth every other day.    . pregabalin (LYRICA) 50 MG capsule Take 1 capsule (50 mg total) by mouth 2 (two) times daily. (Patient taking differently: Take 50 mg by mouth every other day. ) 60 capsule 5   No current facility-administered medications for this visit.     Allergies as of 12/06/2016  . (No Known Allergies)    Vitals: BP 116/72   Pulse 74   Ht 5\' 9"  (1.753 m)   Wt 147 lb 6.4 oz (66.9 kg)   BMI 21.77 kg/m  Last Weight:  Wt Readings from Last 1 Encounters:  12/06/16 147 lb 6.4 oz (66.9 kg)   Last Height:   Ht Readings from Last 1 Encounters:  12/06/16 5\' 9"  (1.753 m)    Physical exam: Exam: Gen: NAD, conversant, well nourised, obese, well groomed                     CV: RRR, no MRG. No Carotid Bruits. No peripheral edema, warm, nontender Eyes: Conjunctivae clear without exudates or hemorrhage  Neuro: Detailed Neurologic Exam  Speech:    Speech is normal; fluent and spontaneous with normal comprehension.  Cognition:    The patient is oriented to person, place, and time;     recent  and remote memory intact;     language fluent;     normal attention, concentration,     fund of knowledge Cranial Nerves:    The pupils are equal, round, and reactive to light. The fundi are normal and spontaneous venous pulsations are present. Visual fields are full to finger confrontation. Extraocular movements are intact. Trigeminal sensation is intact and the muscles of mastication are normal. The face is symmetric. The palate elevates in the midline. Hearing intact. Voice is normal. Shoulder shrug is normal. The tongue has normal motion without fasciculations.   Coordination:    Normal finger to nose and heel to shin. Normal rapid alternating movements.   Gait:    Heel-toe and tandem gait are normal.   Motor  Observation:    No asymmetry, no atrophy, and no involuntary movements noted. Tone:    Normal muscle tone.    Posture:    Posture is normal. normal erect    Strength:    Strength is V/V in the upper and lower limbs.      Sensation: intact to LT     Reflex Exam:  DTR's:    Deep tendon reflexes in the upper and lower extremities are normal bilaterally.   Toes:    The toes are downgoing bilaterally.   Clonus:    Clonus is absent.   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 She has improved on B12 supplementation. We'll also check several other labs today that were not checked in the past to rule out other causes of neuropathy.   Sarina Ill, MD  [facility] [address] [place], [place] [address]  Phone [phone] Fax [phone]  Phone [phone] Fax [phone]  A total of 25 minutes was spent face-to-face with this patient. Over half this time was spent on counseling patient on the b12 deficiency and polyneuropathy diagnosis and different diagnostic and therapeutic options available

## 2016-12-06 NOTE — Patient Instructions (Signed)
Alpha-lipoic acid 400mg -600mg 

## 2016-12-09 LAB — SPECIMEN STATUS REPORT

## 2016-12-11 LAB — HEAVY METALS, BLOOD
ARSENIC: 6 ug/L (ref 2–23)
LEAD, BLOOD: NOT DETECTED ug/dL (ref 0–19)
MERCURY: NOT DETECTED ug/L (ref 0.0–14.9)

## 2016-12-11 LAB — B12 AND FOLATE PANEL
Folate: 15.5 ng/mL (ref >3.0)
Vitamin B-12: 815 pg/mL (ref 232–1245)

## 2016-12-11 LAB — VITAMIN B6: VITAMIN B6: 17 ug/L (ref 2.0–32.8)

## 2016-12-11 LAB — SJOGREN'S SYNDROME ANTIBODS(SSA + SSB): ENA SSA (RO) Ab: <0.2 AI (ref 0.0–0.9)

## 2016-12-11 LAB — HEMOGLOBIN A1C
Est. average glucose Bld gHb Est-mCnc: 114 mg/dL
HEMOGLOBIN A1C: 5.6 % (ref 4.8–5.6)

## 2016-12-11 LAB — RHEUMATOID FACTOR: Rheumatoid fact SerPl-aCnc: 14.6 IU/mL — ABNORMAL HIGH (ref 0.0–13.9)

## 2016-12-11 LAB — VITAMIN B1: Thiamine: 116.9 nmol/L (ref 66.5–200.0)

## 2016-12-12 ENCOUNTER — Telehealth: Payer: Self-pay

## 2016-12-12 NOTE — Telephone Encounter (Signed)
-----   Message from Melvenia Beam, MD sent at 12/10/2016 10:44 AM EDT ----- Labs look fine, previously her RF was elevated and has reduced so I do think this is a false positive thanks

## 2016-12-12 NOTE — Telephone Encounter (Signed)
Called pt to give lab results but unable to reach her. Mailbox full and also not able to leave mssg.

## 2016-12-14 LAB — CYCLIC CITRUL PEPTIDE ANTIBODY, IGG/IGA: CYCLIC CITRULLIN PEPTIDE AB: 7 U (ref 0–19)

## 2016-12-14 LAB — SPECIMEN STATUS REPORT

## 2016-12-14 NOTE — Telephone Encounter (Signed)
Opened in error

## 2016-12-14 NOTE — Telephone Encounter (Signed)
Called pt w/ lab results. Verbalized understanding and appreciation for call. 

## 2017-01-26 ENCOUNTER — Other Ambulatory Visit: Payer: Self-pay | Admitting: Neurology

## 2017-01-30 NOTE — Telephone Encounter (Signed)
Rx printed, signed, faxed to pharmacy. 

## 2017-08-31 ENCOUNTER — Other Ambulatory Visit: Payer: Self-pay | Admitting: Neurology

## 2017-09-01 ENCOUNTER — Other Ambulatory Visit: Payer: Self-pay

## 2017-09-01 ENCOUNTER — Other Ambulatory Visit: Payer: Self-pay | Admitting: Neurology

## 2017-09-01 MED ORDER — PREGABALIN 50 MG PO CAPS: 50.0000 mg | ORAL_CAPSULE | Freq: Every day | ORAL | 6 refills | Status: DC

## 2017-12-11 ENCOUNTER — Ambulatory Visit: Payer: Managed Care, Other (non HMO) | Admitting: Neurology

## 2017-12-12 ENCOUNTER — Encounter (INDEPENDENT_AMBULATORY_CARE_PROVIDER_SITE_OTHER): Payer: Self-pay

## 2017-12-12 ENCOUNTER — Encounter: Payer: Self-pay | Admitting: Neurology

## 2017-12-12 ENCOUNTER — Ambulatory Visit: Payer: Managed Care, Other (non HMO) | Admitting: Neurology

## 2017-12-12 VITALS — BP 119/70 | HR 79 | Ht 68.0 in | Wt 146.0 lb

## 2017-12-12 DIAGNOSIS — E538 Deficiency of other specified B group vitamins: Secondary | ICD-10-CM | POA: Diagnosis not present

## 2017-12-12 MED ORDER — PREGABALIN 50 MG PO CAPS: 50.0000 mg | ORAL_CAPSULE | Freq: Every day | ORAL | 5 refills | Status: DC

## 2017-12-12 NOTE — Patient Instructions (Signed)
Vitamin B12 Deficiency Vitamin B12 deficiency occurs when the body does not have enough vitamin B12. Vitamin B12 is an important vitamin. The body needs vitamin B12:  To make red blood cells.  To make DNA. This is the genetic material inside cells.  To help the nerves work properly so they can carry messages from the brain to the body.  Vitamin B12 deficiency can cause various health problems, such as a low red blood cell count (anemia) or nerve damage. What are the causes? This condition may be caused by:  Not eating enough foods that contain vitamin B12.  Not having enough stomach acid and digestive fluids to properly absorb vitamin B12 from the food that you eat.  Certain digestive system diseases that make it hard to absorb vitamin B12. These diseases include Crohn disease, chronic pancreatitis, and cystic fibrosis.  Pernicious anemia. This is a condition in which the body does not make enough of a protein (intrinsic factor), resulting in too few red blood cells.  Having a surgery in which part of the stomach or small intestine is removed.  Taking certain medicines that make it hard for the body to absorb vitamin B12. These medicines include: ? Heartburn medicine (antacids and proton pump inhibitors). ? An antibiotic medicine called neomycin. ? Some medicines that are used to treat diabetes, tuberculosis, gout, or high cholesterol.  What increases the risk? The following factors may make you more likely to develop a B12 deficiency:  Being older than age 50.  Eating a vegetarian or vegan diet, especially while you are pregnant.  Eating a poor diet while you are pregnant.  Taking certain drugs.  Having alcoholism.  What are the signs or symptoms? In some cases, there are no symptoms of this condition. If the condition leads to anemia or nerve damage, various symptoms can occur, such as:  Weakness.  Fatigue.  Loss of appetite.  Weight loss.  Numbness or tingling  in your hands and feet.  Redness and burning of the tongue.  Confusion or memory problems.  Depression.  Sensory problems, such as color blindness, ringing in the ears, or loss of taste.  Diarrhea or constipation.  Trouble walking.  If anemia is severe, symptoms can include:  Shortness of breath.  Dizziness.  Rapid heart rate (tachycardia).  How is this diagnosed? This condition may be diagnosed with a blood test to measure the level of vitamin B12 in your blood. You may have other tests to help find the cause of your vitamin B12 deficiency. These tests may include:  A complete blood count (CBC). This is a group of tests that measure certain characteristics of blood cells.  A blood test to measure intrinsic factor.  An endoscopy. In this procedure, a thin tube with a camera on the end is used to look into your stomach or intestines.  How is this treated? Treatment for this condition depends on the cause. Common treatment options include:  Changing your eating and drinking habits, such as: ? Eating more foods that contain vitamin B12. ? Drinking less alcohol or no alcohol.  Taking vitamin B12 supplements. Your health care provider will tell you which dosage is best for you.  Getting vitamin B12 injections.  Follow these instructions at home:  Take supplements only as told by your health care provider. Follow the directions carefully.  Get any injections that are prescribed by your health care provider.  Do not miss your appointments.  Eat lots of healthy foods that contain vitamin B12.   Ask your health care provider if you should work with a dietitian. Foods that contain vitamin B12 include: ? Meat. ? Meat from birds (poultry). ? Fish. ? Eggs. ? Cereal and dairy products that are fortified. This means that vitamin B12 has been added to the food. Check the label on the package to see if the food is fortified.  Do not abuse alcohol.  Keep all follow-up visits as  told by your health care provider. This is important. Contact a health care provider if:  Your symptoms come back. Get help right away if:  You develop shortness of breath.  You have chest pain.  You become dizzy or you lose consciousness. This information is not intended to replace advice given to you by your health care provider. Make sure you discuss any questions you have with your health care provider. Document Released: 12/05/2011 Document Revised: 02/24/2016 Document Reviewed: 01/28/2015 Elsevier Interactive Patient Education  2018 Elsevier Inc.  

## 2017-12-12 NOTE — Progress Notes (Signed)
[id number]    Provider:  Dr Jaynee Eagles Referring Provider: Kelton Pillar, MD Primary Care Physician:  Kelton Pillar, MD  CC: Paresthesias  Interval history: Initial B12 was 168.  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 Extensive panel did not reveal any other etiology.  Feeling stable. Lyrica helps with the paresthesias. Feels it impacts her mentation.  But if she doesn't take the lyrica it does feel ok.   Interval history: B12 was 168 2 years ago. Her paresthesias are improved on B12 supplementation. Discussed differential neuropathy. She still does have paresthesias but they are improved. We checked multiple labs and B12 deficiency was the only predisposing factors for neuropathy. However there are several more we can try including Sjogren's which also can cause pure sensory neuropathy. Her rheumatoid factor was minimally elevated in the past so we can recheck with CCP antibodies. Also we can check vitamin B1 space, heavy metals, hemoglobin A1c and repeat B12 and folate.  Interval history: she started taking B12 and her symptoms appeared to be improved. She still has some tingling. She still ahs paresthesias in the fingers and the toes. Discussed B12 deficiency in detail, she takes 102mcg a day. She can increase it to 2036mcg daily. Will perform emg/ncs to assess for any other cause of paresthjesias.  HPI: Sonya Hunt is a 57 y.o. female here as a referral from Dr. Laurann [place] for Paresthesias. The week after mother's day she felt numbness, tingling in all the limbs. Started in the right arm, like her arm was asleep. Also numbness and tingly in the lips. Started slowly and progressed. Got significantly worse in May. Went away for a week with just some paresthesias at night. Is waxing and waning. At it's height in July around the 18th she had a week of the symptoms and it was getting very intense and her heart started beating really fast, they went to the  emergency room and she was tachycardic. Paresthesias went up to 8/10 in discomfort. They go away for a few weeks and then come back. Symptoms always start slowly and ascend with a strange sensation of paresthesias and numbness, always starting in the right arm. No neck pain, no back pain, no weakness. She has warm sensations, maybe entering menopause, b which tends to happen at the same time as the paresthesias. No diabetes. She works out regularly, runs. Went ot the cardiologist and EKG was normal. Right arm always more intense. No other focal symptoms, denies weakness, vision changes, speech or swallow problems. She has an overactive bladder. Nothing seems to make symptoms worse or trigger them, better if walking around or moving. No SOB. Sometimes when sitting for long periods has the urge to move legs and also gets the same sensations in bed, like she has to move legs, as in restless leg syndrome. Not sleeping well, wakes every hour or two. Not snoring, no apneic episodes. Partner doesn't notice abnormal movements. No recent depression or stress. Life has been less stressful since son moved out of house and she feels good. No cramping. Balance is fine. No dizzyness. Brain feels "fuzzy" at times. No recent illness or preceding illness. Some cognitive "fuzziness" for a few months. No headaches. Today her lips are numb symmetrically around the lips.  She currently has the paresthesias, started 8 days ago. Previous to this this she had gone 21 days without symptoms, had a small bout the beginning of august. Describes symptoms of pins and needles up the whole arm up to shoulder  on right, left arm in the fingers, legs to the knees, and around the lips.     Reviewed notes, labs and imaging from outside physicians, which showed recent CBC unremarkable, BMP with K 3.4. TSH 2.08.   Review of Systems: Patient complains of symptoms per HPI as well as the following symptoms: Numbness, speech difficulty,  restless leg, palpitations. Pertinent negatives per HPI. All others negative.   Social History   Socioeconomic History  . Marital status: Married    Spouse name: [name]   . Number of children: 3  . Years of education: 12+  . Highest education level: Not on file  Social Needs  . Financial resource strain: Not on file  . Food insecurity - worry: Not on file  . Food insecurity - inability: Not on file  . Transportation needs - medical: Not on file  . Transportation needs - non-medical: Not on file  Occupational History  . Occupation: enrollment    Employer: [employer]  Tobacco Use  . Smoking status: Never Smoker  . Smokeless tobacco: Never Used  Substance and Sexual Activity  . Alcohol use: No  . Drug use: No  . Sexual activity: Not on file  Other Topics Concern  . Not on file  Social History Narrative   Patient lives at home with husband [name].    Patient has 3 children.    Patient has a college education.    Patient is right handed.    Drinks 2-3 cups of caffeine daily    Family History  Problem Relation Age of Onset  . Heart disease Mother   . Hypertension Mother   . Prostate cancer Father   . Other Father        MDS  . Hypertension Brother   . Hypertension Son     Past Medical History:  Diagnosis Date  . Basal cell carcinoma     Past Surgical History:  Procedure Laterality Date  . APPENDECTOMY  1992  . Excision of BCC R arm      Current Outpatient Medications  Medication Sig Dispense Refill  . cyanocobalamin 1000 MCG tablet Take 1,000 mcg by mouth every other day.    . pregabalin (LYRICA) 50 MG capsule Take 1 capsule (50 mg total) by mouth daily. 30 capsule 5   No current facility-administered medications for this visit.     Allergies as of 12/12/2017  . (No Known Allergies)    Vitals: BP 119/70 (BP Location: Right Arm, Patient Position: Sitting)   Pulse 79   Ht 5\' 8"  (1.727 m)   Wt 146 lb (66.2 kg)   BMI 22.20 kg/m    Last Weight:  Wt Readings from Last 1 Encounters:  12/12/17 146 lb (66.2 kg)   Last Height:   Ht Readings from Last 1 Encounters:  12/12/17 5\' 8"  (1.727 m)    Physical exam: Exam: Gen: NAD, conversant, well nourised, obese, well groomed                     CV: RRR, no MRG. No Carotid Bruits. No peripheral edema, warm, nontender Eyes: Conjunctivae clear without exudates or hemorrhage  Neuro: Detailed Neurologic Exam  Speech:    Speech is normal; fluent and spontaneous with normal comprehension.  Cognition:    The patient is oriented to person, place, and time;     recent and remote memory intact;     language fluent;     normal attention, concentration,  fund of knowledge Cranial Nerves:    The pupils are equal, round, and reactive to light. The fundi are normal and spontaneous venous pulsations are present. Visual fields are full to finger confrontation. Extraocular movements are intact. Trigeminal sensation is intact and the muscles of mastication are normal. The face is symmetric. The palate elevates in the midline. Hearing intact. Voice is normal. Shoulder shrug is normal. The tongue has normal motion without fasciculations.   Coordination:    Normal finger to nose and heel to shin. Normal rapid alternating movements.   Gait:    Heel-toe and tandem gait are normal.   Motor Observation:    No asymmetry, no atrophy, and no involuntary movements noted. Tone:    Normal muscle tone.    Posture:    Posture is normal. normal erect    Strength:    Strength is V/V in the upper and lower limbs.      Sensation: intact to LT     Reflex Exam:  DTR's:    Deep tendon reflexes in the upper and lower extremities are normal bilaterally.   Toes:    The toes are downgoing bilaterally.   Clonus:    Clonus is absent.   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 She has improved on B12  supplementation.   - discussed b12 deficiency and neuropathy Will check B12 today Discussed diet and foods rich in [place], MD  [facility] [address] [place], [place] [address]  Phone [phone] Fax [phone]  Phone [phone] Fax [phone]  A total of 15 minutes was spent face-to-face with this patient. Over half this time was spent on counseling patient on the b12 deficiency and polyneuropathy diagnosis and different diagnostic and therapeutic options available

## 2017-12-14 ENCOUNTER — Telehealth: Payer: Self-pay | Admitting: *Deleted

## 2017-12-14 LAB — COMPREHENSIVE METABOLIC PANEL
ALBUMIN: 4.6 g/dL (ref 3.5–5.5)
ALT: 17 IU/L (ref 0–32)
AST: 16 IU/L (ref 0–40)
Albumin/Globulin Ratio: 2 (ref 1.2–2.2)
Alkaline Phosphatase: 70 IU/L (ref 39–117)
BUN / CREAT RATIO: 16 (ref 9–23)
BUN: 13 mg/dL (ref 6–24)
Bilirubin Total: 0.5 mg/dL (ref 0.0–1.2)
CO2: 23 mmol/L (ref 20–29)
CREATININE: 0.81 mg/dL (ref 0.57–1.00)
Calcium: 9.7 mg/dL (ref 8.7–10.2)
Chloride: 106 mmol/L (ref 96–106)
GFR, EST AFRICAN AMERICAN: 94 mL/min/{1.73_m2} (ref >59)
GFR, EST NON AFRICAN AMERICAN: 81 mL/min/{1.73_m2} (ref >59)
GLUCOSE: 81 mg/dL (ref 65–99)
Globulin, Total: 2.3 g/dL (ref 1.5–4.5)
Potassium: 4.2 mmol/L (ref 3.5–5.2)
Sodium: 144 mmol/L (ref 134–144)
TOTAL PROTEIN: 6.9 g/dL (ref 6.0–8.5)

## 2017-12-14 LAB — CBC
HEMATOCRIT: 38 % (ref 34.0–46.6)
HEMOGLOBIN: 12.8 g/dL (ref 11.1–15.9)
MCH: 29.4 pg (ref 26.6–33.0)
MCHC: 33.7 g/dL (ref 31.5–35.7)
MCV: 87 fL (ref 79–97)
Platelets: 257 10*3/uL (ref 150–379)
RBC: 4.35 x10E6/uL (ref 3.77–5.28)
RDW: 13.6 % (ref 12.3–15.4)
WBC: 6.3 10*3/uL (ref 3.4–10.8)

## 2017-12-14 LAB — LIPID PANEL
CHOL/HDL RATIO: 3.6 ratio (ref 0.0–4.4)
CHOLESTEROL TOTAL: 245 mg/dL — AB (ref 100–199)
HDL: 69 mg/dL (ref >39)
LDL CALC: 157 mg/dL — AB (ref 0–99)
Triglycerides: 94 mg/dL (ref 0–149)
VLDL CHOLESTEROL CAL: 19 mg/dL (ref 5–40)

## 2017-12-14 LAB — METHYLMALONIC ACID, SERUM: Methylmalonic Acid: 182 nmol/L (ref 0–378)

## 2017-12-14 LAB — VITAMIN D 25 HYDROXY (VIT D DEFICIENCY, FRACTURES): VIT D 25 HYDROXY: 29.1 ng/mL — AB (ref 30.0–100.0)

## 2017-12-14 LAB — TSH: TSH: 2.1 u[IU]/mL (ref 0.450–4.500)

## 2017-12-14 LAB — B12 AND FOLATE PANEL
Folate: 11.1 ng/mL (ref >3.0)
Vitamin B-12: 512 pg/mL (ref 232–1245)

## 2017-12-14 NOTE — Telephone Encounter (Signed)
That's totally fine thanks!

## 2017-12-14 NOTE — Telephone Encounter (Addendum)
Called pt & discussed her lab results as follows: Vitamin B is 512 which is decreased from last test although still normal. Her Vitamin D is low, recommend daily Ca with Vit D as we discussed. Could send her in 4 weeks of high dose prescription Vitamin D to get her started but taking it OTC is fine as well. Her cholesterol is elevated at 245 and her ldl is 157. May consider a low dose of a statin like Lipitor 10mg  at bedtime.   This was all discussed in detail with the patient and she verbalized understanding of everything. She stated the does have an OTC Calcium with Vitamin D tablet and she has also not been taking her B12 lately. She will start taking these and if she changes her mind about the prescription dose of Vitamin D she will call back. She also will research the Lipitor and think about it. She will call us back with her decision. Pt also asked if she could possibly be seen again in 6 months and have f/u lipid panel done. She stated that knows she needs to get a PCP and she will work on that. RN informed pt that she will see if Dr. Jaynee Eagles authorizes repeat labs in 6 months. The patient verbalized appreciation. She will call us back with her decision on Lipitor and let us know if she changes her mind about the Vitamin D.    ----- Message from Melvenia Beam, MD sent at 12/14/2017  1:37 PM EDT ----- Vitamin B is 512 which is decreased from last test. Still normal but decreasing. Her Vitamin D is low, recommend daily Ca with Vit D as we discussed. Could send her in 4 weeks of high dose prescription Vitamin D to get her started but taking it OTC is fine as well. Her cholesterol is elevated at 245 and he ldl is 157. May consider a low dose of a statin like Lipitor 10mg  at bedtime. Discuss and let me know.

## 2017-12-18 NOTE — Telephone Encounter (Signed)
Spoke with patient. RN scheduled her an appt for Tuesday 2/17 @ 11:00 arrival time 10:30. She also reported that she would like the Vitamin D prescription strength sent to [facility] in [place]. She will research Lipitor and then call office back with a decision. She verbalized appreciation for the call and had no further questions.

## 2017-12-19 ENCOUNTER — Other Ambulatory Visit: Payer: Self-pay | Admitting: Neurology

## 2017-12-19 MED ORDER — VITAMIN D (ERGOCALCIFEROL) 1.25 MG (50000 UNIT) PO CAPS: 50000.0000 [IU] | ORAL_CAPSULE | ORAL | 0 refills | Status: DC

## 2017-12-19 MED ORDER — ATORVASTATIN CALCIUM 10 MG PO TABS: 10.0000 mg | ORAL_TABLET | Freq: Every day | ORAL | 11 refills | Status: DC

## 2017-12-19 NOTE — Telephone Encounter (Signed)
Attempted to call pt. If she calls back, please let her know that Dr. Jaynee Eagles sent in the prescription for Vitamin D. Please take once every 7 days for 5 weeks then continue over the counter Vitamin D supplementation after that long term.

## 2017-12-19 NOTE — Telephone Encounter (Signed)
Vitamin D scrpt sent. You take it for 5 weeks then stop but continue daily Vitamin D supplementation after that long term.

## 2017-12-19 NOTE — Telephone Encounter (Signed)
Pt returned RN's call. Message relayed, pt understood with no questions. Patient also said she is wanting to start Lipitor as discussed, rx can be sent to [place] [facility]

## 2017-12-19 NOTE — Telephone Encounter (Signed)
Ordered. thanks

## 2018-01-15 ENCOUNTER — Other Ambulatory Visit: Payer: Self-pay | Admitting: Neurology

## 2018-01-23 ENCOUNTER — Other Ambulatory Visit: Payer: Self-pay | Admitting: Neurology

## 2018-01-24 NOTE — Telephone Encounter (Signed)
Spoke with patient. She said that she would eventually like to switch her pharmacy to [facility] on [address], but for now she will keep the current Lyrica prescription at [facility].

## 2018-03-10 ENCOUNTER — Encounter (HOSPITAL_COMMUNITY): Payer: Self-pay | Admitting: Nurse Practitioner

## 2018-03-10 ENCOUNTER — Emergency Department (HOSPITAL_COMMUNITY)
Admission: EM | Admit: 2018-03-10 | Discharge: 2018-03-10 | Disposition: A | Discharge status: Home / Self Care | Payer: Managed Care, Other (non HMO) | Attending: Emergency Medicine | Admitting: Emergency Medicine

## 2018-03-10 ENCOUNTER — Emergency Department (HOSPITAL_COMMUNITY): Payer: Managed Care, Other (non HMO)

## 2018-03-10 DIAGNOSIS — R42 Dizziness and giddiness: Secondary | ICD-10-CM | POA: Diagnosis present

## 2018-03-10 DIAGNOSIS — Z85828 Personal history of other malignant neoplasm of skin: Secondary | ICD-10-CM | POA: Diagnosis not present

## 2018-03-10 DIAGNOSIS — Z79899 Other long term (current) drug therapy: Secondary | ICD-10-CM | POA: Insufficient documentation

## 2018-03-10 LAB — CBC
HCT: 37.6 % (ref 36.0–46.0)
Hemoglobin: 12.6 g/dL (ref 12.0–15.0)
MCH: 30 pg (ref 26.0–34.0)
MCHC: 33.5 g/dL (ref 30.0–36.0)
MCV: 89.5 fL (ref 78.0–100.0)
PLATELETS: 214 10*3/uL (ref 150–400)
RBC: 4.2 MIL/uL (ref 3.87–5.11)
RDW: 12.7 % (ref 11.5–15.5)
WBC: 11.2 10*3/uL — ABNORMAL HIGH (ref 4.0–10.5)

## 2018-03-10 LAB — BASIC METABOLIC PANEL
Anion gap: 9 (ref 5–15)
BUN: 12 mg/dL (ref 6–20)
CALCIUM: 9.5 mg/dL (ref 8.9–10.3)
CO2: 24 mmol/L (ref 22–32)
CREATININE: 0.7 mg/dL (ref 0.44–1.00)
Chloride: 108 mmol/L (ref 101–111)
GFR calc Af Amer: >60 mL/min (ref >60)
GFR calc non Af Amer: >60 mL/min (ref >60)
GLUCOSE: 139 mg/dL — AB (ref 65–99)
Potassium: 3.8 mmol/L (ref 3.5–5.1)
Sodium: 141 mmol/L (ref 135–145)

## 2018-03-10 LAB — URINALYSIS, ROUTINE W REFLEX MICROSCOPIC
Bilirubin Urine: NEGATIVE
Glucose, UA: NEGATIVE mg/dL
HGB URINE DIPSTICK: NEGATIVE
Ketones, ur: NEGATIVE mg/dL
Leukocytes, UA: NEGATIVE
Nitrite: NEGATIVE
PROTEIN: NEGATIVE mg/dL
SPECIFIC GRAVITY, URINE: 1.018 (ref 1.005–1.030)
pH: 7 (ref 5.0–8.0)

## 2018-03-10 MED ORDER — SODIUM CHLORIDE 0.9 % IV BOLUS
500.0000 mL | Freq: Once | INTRAVENOUS | Status: AC
  Administered 2018-03-10: 500 mL via INTRAVENOUS

## 2018-03-10 MED ORDER — SODIUM CHLORIDE 0.9 % IV BOLUS
1000.0000 mL | Freq: Once | INTRAVENOUS | Status: AC
  Administered 2018-03-10: 1000 mL via INTRAVENOUS

## 2018-03-10 MED ORDER — MECLIZINE HCL 25 MG PO TABS: 25.0000 mg | ORAL_TABLET | Freq: Three times a day (TID) | ORAL | 0 refills | Status: DC | PRN

## 2018-03-10 MED ORDER — MECLIZINE HCL 25 MG PO TABS
25.0000 mg | ORAL_TABLET | Freq: Once | ORAL | Status: AC
  Administered 2018-03-10: 25 mg via ORAL
  Filled 2018-03-10: qty 1

## 2018-03-10 NOTE — ED Provider Notes (Signed)
[facility] DEPT Provider Note   CSN: [id number] Arrival date & time: 03/10/18  1506     History   Chief Complaint Chief Complaint  Patient presents with  . Dizziness  . Emesis    HPI Sonya Hunt is a 57 y.o. female with a past medical history of B12 deficiency, hyperlipidemia, who presents to ED for evaluation of dizziness that began last night.  States that she came home from mowing the lawn feeling like her usual self.  She woke up around 3:30 AM to use the bathroom when she began having dizzy spells feeling like she was "off balance."  She went back to sleep and woke up around 10 AM with worsening of similar symptoms.  She has had 3 episodes of nonbloody, nonbilious emesis when the dizziness occurs.  She states that when she is laying down with her head still, she feels a lot better.  She has not taken any new medications or taking any medicines to help with her symptoms.  No sick contacts with similar symptoms.  Denies any injuries or falls, headache, vision changes, abdominal pain, chest pain, shortness of breath, numbness in arms or legs different from her typical neuropathy at night.   HPI  Past Medical History:  Diagnosis Date  . Basal cell carcinoma     Patient Active Problem List   Diagnosis Date Noted  . B12 deficiency 09/14/2015  . Neuropathy 09/14/2015  . Paresthesias 06/09/2014  . Tachycardia 05/15/2014    Past Surgical History:  Procedure Laterality Date  . APPENDECTOMY  1992  . Excision of BCC R arm       OB History   None      Home Medications    Prior to Admission medications   Medication Sig Start Date End Date Taking? Authorizing Provider  atorvastatin (LIPITOR) 10 MG tablet Take 1 tablet (10 mg total) by mouth daily at 6 PM. 12/19/17   Melvenia Beam, MD  cyanocobalamin 1000 MCG tablet Take 1,000 mcg by mouth every other day.    [provider]  meclizine (ANTIVERT) 25 MG tablet Take 1 tablet (25 mg  total) by mouth 3 (three) times daily as needed for dizziness. 03/10/18   Regis Wiland, PA-C  pregabalin (LYRICA) 50 MG capsule Take 1 capsule (50 mg total) by mouth daily. 12/12/17   Melvenia Beam, MD  Vitamin D, Ergocalciferol, (DRISDOL) 50000 units CAPS capsule Take 1 capsule (50,000 Units total) by mouth every 7 (seven) days. 12/19/17   Melvenia Beam, MD    Family History Family History  Problem Relation Age of Onset  . Heart disease Mother   . Hypertension Mother   . Prostate cancer Father   . Other Father        MDS  . Hypertension Brother   . Hypertension Son     Social History Social History   Tobacco Use  . Smoking status: Never Smoker  . Smokeless tobacco: Never Used  Substance Use Topics  . Alcohol use: No  . Drug use: No     Allergies   Patient has no known allergies.   Review of Systems Review of Systems  Constitutional: Negative for appetite change, chills and fever.  HENT: Negative for ear pain, rhinorrhea, sneezing and sore throat.   Eyes: Negative for photophobia and visual disturbance.  Respiratory: Negative for cough, chest tightness, shortness of breath and wheezing.   Cardiovascular: Negative for chest pain and palpitations.  Gastrointestinal: Positive for  nausea and vomiting. Negative for abdominal pain, blood in stool, constipation and diarrhea.  Genitourinary: Negative for dysuria, hematuria and urgency.  Musculoskeletal: Negative for myalgias.  Skin: Negative for rash.  Neurological: Positive for dizziness. Negative for weakness, light-headedness and headaches.     Physical Exam Updated Vital Signs BP 128/72   Pulse 94   Temp 98.6 F (37 C) (Oral)   Resp 16   SpO2 100%   Physical Exam  Constitutional: She is oriented to person, place, and time. She appears well-developed and well-nourished. No distress.  HENT:  Head: Normocephalic and atraumatic.  Nose: Nose normal.  Eyes: Pupils are equal, round, and reactive to light.  Conjunctivae and EOM are normal. Right eye exhibits no discharge. Left eye exhibits no discharge. No scleral icterus.  Horizontal nystagmus noted bilaterally.  Neck: Normal range of motion. Neck supple.  No meningismus.  Cardiovascular: Normal rate, regular rhythm, normal heart sounds and intact distal pulses. Exam reveals no gallop and no friction rub.  No murmur heard. Pulmonary/Chest: Effort normal and breath sounds normal. No respiratory distress.  Abdominal: Soft. Bowel sounds are normal. She exhibits no distension. There is no tenderness. There is no guarding.  No abdominal tenderness to palpation.  Musculoskeletal: Normal range of motion. She exhibits no edema.  Neurological: She is alert and oriented to person, place, and time. No cranial nerve deficit. She exhibits normal muscle tone. Coordination normal.  Pupils reactive. No facial asymmetry noted. Cranial nerves appear grossly intact. Sensation intact to light touch on face, BUE and BLE. Strength 5/5 in BUE and BLE.   Skin: Skin is warm and dry. No rash noted.  Psychiatric: She has a normal mood and affect.  Nursing note and vitals reviewed.    ED Treatments / Results  Labs (all labs ordered are listed, but only abnormal results are displayed) Labs Reviewed  BASIC METABOLIC PANEL - Abnormal; Notable for the following components:      Result Value   Glucose, Bld 139 (*)    All other components within normal limits  CBC - Abnormal; Notable for the following components:   WBC 11.2 (*)    All other components within normal limits  URINALYSIS, ROUTINE W REFLEX MICROSCOPIC    EKG EKG Interpretation  Date/Time:  Saturday March 10 2018 16:26:01 EDT Ventricular Rate:  98 PR Interval:    QRS Duration: 97 QT Interval:  362 QTC Calculation: 463 R Axis:   73 Text Interpretation:  Sinus rhythm since last tracing no significant change Confirmed by Daleen Bo [phone]) on 03/10/2018 4:57:57 PM   Radiology Ct Head Wo  Contrast  Result Date: 03/10/2018 CLINICAL DATA:  Dizziness.  Vomiting and elevated blood pressure. EXAM: CT HEAD WITHOUT CONTRAST TECHNIQUE: Contiguous axial images were obtained from the base of the skull through the vertex without intravenous contrast. COMPARISON:  None. FINDINGS: Brain: No mass lesion, hemorrhage, hydrocephalus, acute infarct, intra-axial, or extra-axial fluid collection. Vascular: No hyperdense vessel or unexpected calcification. Skull: Normal Sinuses/Orbits: Normal imaged portions of the orbits and globes. Clear paranasal sinuses and mastoid air cells. Other: None. IMPRESSION: Normal head CT. Electronically Signed   By: Abigail Miyamoto M.D.   On: 03/10/2018 17:33    Procedures Procedures (including critical care time)  Medications Ordered in ED Medications  sodium chloride 0.9 % bolus 1,000 mL (0 mLs Intravenous Stopped 03/10/18 1822)  sodium chloride 0.9 % bolus 500 mL (0 mLs Intravenous Stopped 03/10/18 1945)  meclizine (ANTIVERT) tablet 25 mg (25 mg Oral Given  03/10/18 1843)     Initial Impression / Assessment and Plan / ED Course  I have reviewed the triage vital signs and the nursing notes.  Pertinent labs & imaging results that were available during my care of the patient were reviewed by me and considered in my medical decision making (see chart for details).     57 year old female with past medical history of B12 deficiency presents for dizziness that began last night.  Symptoms began gradually.  She was in her usual state of health when she came home after mowing the lawn earlier in the evening yesterday.  She felt that she was "off balance."  This is caused her to have 3 episodes of nonbloody, nonbilious emesis.  Denies any headache, falls, vision changes, abdominal pain, chest pain, shortness of breath, numbness in arms or legs.  On physical exam she is overall well-appearing.  She has no deficits on her neurological exam noted.  She does have horizontal nystagmus  noted bilaterally.  BMP, CBC, urinalysis all unremarkable.  EKG shows normal sinus rhythm.  CT of the head returned as negative for acute abnormality.  Suspect that her symptoms are due to BPPV.  Patient given fluids and meclizine here with significant improvement in her symptoms.  Vital signs have improved as well.  She continues to have no deficits on neurological examination.  I encouraged her to follow-up with her primary care provider and to take meclizine as needed.  Advised to return to ED for any severe worsening symptoms.  Portions of this note were generated with [employer]. Dictation errors may occur despite best attempts at proofreading.   Final Clinical Impressions(s) / ED Diagnoses   Final diagnoses:  Vertigo    ED Discharge Orders        Ordered    meclizine (ANTIVERT) 25 MG tablet  3 times daily PRN     03/10/18 1955       Delia Heady, PA-C 03/10/18 2313    Little, Wenda Overland, MD 03/11/18 1450

## 2018-03-10 NOTE — ED Triage Notes (Signed)
Pt is c/o dizziness that started about 12 hours ago. She adds that the dizziness is associated with 3 episodes of vomiting and elevated BP. She denies having a HA, blurred/changes in vision or change in speech.

## 2018-03-10 NOTE — ED Notes (Signed)
Patient ambulated to bathroom, states dizziness is still present but is improved significantly.

## 2018-03-10 NOTE — Discharge Instructions (Signed)
Please follow-up with your primary care provider for further evaluation. Take meclizine as needed for dizziness.

## 2018-03-10 NOTE — ED Notes (Signed)
ORTHOSTATIC VITALS  Supine:  BP 148/63 HR: 108 RR: 15  Sitting  BP: 155/85 HR: 107 RR: 15  Standing: 142/87 HR: 115 RR:16

## 2018-06-12 ENCOUNTER — Ambulatory Visit: Payer: Self-pay | Admitting: Neurology

## 2018-07-11 ENCOUNTER — Other Ambulatory Visit: Payer: Self-pay | Admitting: *Deleted

## 2018-07-11 MED ORDER — PREGABALIN 50 MG PO CAPS: 50.0000 mg | ORAL_CAPSULE | Freq: Every day | ORAL | 5 refills | Status: DC

## 2018-07-11 NOTE — Telephone Encounter (Signed)
Last refill 05/29/18 #30 per registry.

## 2018-12-18 ENCOUNTER — Ambulatory Visit: Payer: Managed Care, Other (non HMO) | Admitting: Neurology

## 2018-12-24 ENCOUNTER — Other Ambulatory Visit: Payer: Self-pay | Admitting: Neurology

## 2019-02-05 ENCOUNTER — Other Ambulatory Visit: Payer: Self-pay | Admitting: Neurology

## 2019-03-15 ENCOUNTER — Other Ambulatory Visit: Payer: Self-pay | Admitting: Neurology

## 2019-03-18 ENCOUNTER — Telehealth: Payer: Self-pay

## 2019-03-18 NOTE — Telephone Encounter (Signed)
Unable to get in contact with the patient. VM was full. I will attempt to call back later today.    If patient calls back please convert her appt with Amy into a mychart video visit.

## 2019-03-19 ENCOUNTER — Telehealth: Payer: Managed Care, Other (non HMO) | Admitting: Family Medicine

## 2019-04-02 ENCOUNTER — Encounter: Payer: Self-pay | Admitting: Family Medicine

## 2019-04-02 ENCOUNTER — Ambulatory Visit: Payer: Managed Care, Other (non HMO) | Admitting: Family Medicine

## 2019-04-02 ENCOUNTER — Other Ambulatory Visit: Payer: Self-pay

## 2019-04-02 VITALS — BP 121/70 | HR 71 | Temp 98.0°F | Ht 68.0 in | Wt 147.2 lb

## 2019-04-02 DIAGNOSIS — E559 Vitamin D deficiency, unspecified: Secondary | ICD-10-CM | POA: Diagnosis not present

## 2019-04-02 DIAGNOSIS — E538 Deficiency of other specified B group vitamins: Secondary | ICD-10-CM | POA: Diagnosis not present

## 2019-04-02 DIAGNOSIS — G629 Polyneuropathy, unspecified: Secondary | ICD-10-CM

## 2019-04-02 NOTE — Progress Notes (Signed)
PATIENT: Sonya Hunt DOB: [birth year]  REASON FOR VISIT: follow up HISTORY FROM: patient  Chief Complaint  Patient presents with  . Follow-up    Yearly f/u. Alone. Rm 1. No new concerns at this time.      HISTORY OF PRESENT ILLNESS: Today 04/03/19 Sonya Hunt is a 58 y.o. 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  She is doing well today and without complaints.  She does report significant improvement in tingling after starting B12 supplementation.  She does occasionally continue to have some numbness and tingling.  She is also taking vitamin D supplementation for deficiency.  She does not have a PCP currently.  HISTORY: (copied from Dr Cathren Laine note on 12/12/2017)  Interval history: Initial B12 was 168.  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 Extensive panel did not reveal any other etiology.  Feeling stable. Lyrica helps with the paresthesias. Feels it impacts her mentation.  But if she doesn't take the lyrica it does feel ok.   Interval history: B12 was 168 2 years ago. Her paresthesias are improved on B12 supplementation. Discussed differential neuropathy. She still does have paresthesias but they are improved. We checked multiple labs and B12 deficiency was the only predisposing factors for neuropathy. However there are several more we can try including Sjogren's which also can cause pure sensory neuropathy. Her rheumatoid factor was minimally elevated in the past so we can recheck with CCP antibodies. Also we can check vitamin B1 space, heavy metals, hemoglobin A1c and repeat B12 and folate.  Interval history: she started taking B12 and her symptoms appeared to be improved. She still has some tingling. She still ahs paresthesias in the fingers and the toes. Discussed B12 deficiency in detail, she takes 1064mcg a day. She can increase it to 2071mcg daily. Will perform emg/ncs to assess for any other cause of paresthjesias.  HPI:  Sonya Hunt is a 58 y.o. female here as a referral from Dr. Laurann Montana for Paresthesias. The week after mother's day she felt numbness, tingling in all the limbs. Started in the right arm, like her arm was asleep. Also numbness and tingly in the lips. Started slowly and progressed. Got significantly worse in May. Went away for a week with just some paresthesias at night. Is waxing and waning. At it's height in July around the 18th she had a week of the symptoms and it was getting very intense and her heart started beating really fast, they went to the emergency room and she was tachycardic. Paresthesias went up to 8/10 in discomfort. They go away for a few weeks and then come back. Symptoms always start slowly and ascend with a strange sensation of paresthesias and numbness, always starting in the right arm. No neck pain, no back pain, no weakness. She has warm sensations, maybe entering menopause, b which tends to happen at the same time as the paresthesias. No diabetes. She works out regularly, runs. Went ot the cardiologist and EKG was normal. Right arm always more intense. No other focal symptoms, denies weakness, vision changes, speech or swallow problems. She has an overactive bladder. Nothing seems to make symptoms worse or trigger them, better if walking around or moving. No SOB. Sometimes when sitting for long periods has the urge to move legs and also gets the same sensations in bed, like she has to move legs, as in restless leg syndrome. Not sleeping well, wakes every hour or two. Not snoring, no  apneic episodes. Partner doesn't notice abnormal movements. No recent depression or stress. Life has been less stressful since son moved out of house and she feels good. No cramping. Balance is fine. No dizzyness. Brain feels "fuzzy" at times. No recent illness or preceding illness. Some cognitive "fuzziness" for a few months. No headaches. Today her lips are numb symmetrically around the lips.  She currently  has the paresthesias, started 8 days ago. Previous to this this she had gone 21 days without symptoms, had a small bout the beginning of august. Describes symptoms of pins and needles up the whole arm up to shoulder on right, left arm in the fingers, legs to the knees, and around the lips.     Reviewed notes, labs and imaging from outside physicians, which showed recent CBC unremarkable, BMP with K 3.4. TSH 2.08.    REVIEW OF SYSTEMS: Out of a complete 14 system review of symptoms, the patient complains only of the following symptoms, numbness and tingling upper extremities and all other reviewed systems are negative.  ALLERGIES: No Known Allergies  HOME MEDICATIONS: Outpatient Medications Prior to Visit  Medication Sig Dispense Refill  . atorvastatin (LIPITOR) 10 MG tablet TAKE 1 TABLET (10 MG TOTAL) BY MOUTH DAILY AT 6 PM. 30 tablet 2  . cyanocobalamin 1000 MCG tablet Take 1,000 mcg by mouth every other day.    . meclizine (ANTIVERT) 25 MG tablet Take 1 tablet (25 mg total) by mouth 3 (three) times daily as needed for dizziness. 30 tablet 0  . pregabalin (LYRICA) 50 MG capsule TAKE 1 CAPSULE BY MOUTH DAILY 30 capsule 1  . Vitamin D, Ergocalciferol, (DRISDOL) 50000 units CAPS capsule Take 1 capsule (50,000 Units total) by mouth every 7 (seven) days. 5 capsule 0   No facility-administered medications prior to visit.     PAST MEDICAL HISTORY: Past Medical History:  Diagnosis Date  . Basal cell carcinoma     PAST SURGICAL HISTORY: Past Surgical History:  Procedure Laterality Date  . APPENDECTOMY  1992  . Excision of BCC R arm      FAMILY HISTORY: Family History  Problem Relation Age of Onset  . Heart disease Mother   . Hypertension Mother   . Prostate cancer Father   . Other Father        MDS  . Hypertension Brother   . Hypertension Son     SOCIAL HISTORY: Social History   Socioeconomic History  . Marital status: Married    Spouse name: [name]   . Number of  children: 3  . Years of education: 12+  . Highest education level: Not on file  Occupational History  . Occupation: enrollment    Employer: [employer]  Social Needs  . Financial resource strain: Not on file  . Food insecurity    Worry: Not on file    Inability: Not on file  . Transportation needs    Medical: Not on file    Non-medical: Not on file  Tobacco Use  . Smoking status: Never Smoker  . Smokeless tobacco: Never Used  Substance and Sexual Activity  . Alcohol use: No  . Drug use: No  . Sexual activity: Not on file  Lifestyle  . Physical activity    Days per week: Not on file    Minutes per session: Not on file  . Stress: Not on file  Relationships  . Social Herbalist on phone: Not on file    Gets  together: Not on file    Attends religious service: Not on file    Active member of club or organization: Not on file    Attends meetings of clubs or organizations: Not on file    Relationship status: Not on file  . Intimate partner violence    Fear of current or ex partner: Not on file    Emotionally abused: Not on file    Physically abused: Not on file    Forced sexual activity: Not on file  Other Topics Concern  . Not on file  Social History Narrative   Patient lives at home with husband [name].    Patient has 3 children.    Patient has a college education.    Patient is right handed.    Drinks 2-3 cups of caffeine daily      PHYSICAL EXAM  Vitals:   04/02/19 1300  BP: 121/70  Pulse: 71  Temp: 98 F (36.7 C)  TempSrc: Oral  Weight: 147 lb 3.2 oz (66.8 kg)  Height: 5\' 8"  (1.727 m)   Body mass index is 22.38 kg/m.  Generalized: Well developed, in no acute distress  Cardiology: normal rate and rhythm, no murmur noted Neurological examination  Mentation: Alert oriented to time, place, history taking. Follows all commands speech and language fluent Cranial nerve II-XII: Pupils were equal round reactive to light. Extraocular  movements were full, visual field were full on confrontational test. Facial sensation and strength were normal. Uvula tongue midline. Head turning and shoulder shrug  were normal and symmetric. Motor: The motor testing reveals 5 over 5 strength of all 4 extremities. Good symmetric motor tone is noted throughout.  Sensory: Sensory testing is intact to soft touch on all 4 extremities. No evidence of extinction is noted.  Coordination: Cerebellar testing reveals good finger-nose-finger and heel-to-shin bilaterally.  Gait and station: Gait is normal.    DIAGNOSTIC DATA (LABS, IMAGING, TESTING) - I reviewed patient records, labs, notes, testing and imaging myself where available.  No flowsheet data found.   Lab Results  Component Value Date   WBC 11.2 (H) 03/10/2018   HGB 12.6 03/10/2018   HCT 37.6 03/10/2018   MCV 89.5 03/10/2018   PLT 214 03/10/2018      Component Value Date/Time   NA 141 03/10/2018 1536   NA 144 12/12/2017 1246   K 3.8 03/10/2018 1536   CL 108 03/10/2018 1536   CO2 24 03/10/2018 1536   GLUCOSE 139 (H) 03/10/2018 1536   BUN 12 03/10/2018 1536   BUN 13 12/12/2017 1246   CREATININE 0.70 03/10/2018 1536   CALCIUM 9.5 03/10/2018 1536   PROT 6.9 12/12/2017 1246   ALBUMIN 4.6 12/12/2017 1246   AST 16 12/12/2017 1246   ALT 17 12/12/2017 1246   ALKPHOS 70 12/12/2017 1246   BILITOT 0.5 12/12/2017 1246   GFRNONAA >60 03/10/2018 1536   GFRAA >60 03/10/2018 1536   Lab Results  Component Value Date   CHOL 245 (H) 12/12/2017   HDL 69 12/12/2017   LDLCALC 157 (H) 12/12/2017   TRIG 94 12/12/2017   CHOLHDL 3.6 12/12/2017   Lab Results  Component Value Date   HGBA1C 5.6 12/06/2016   Lab Results  Component Value Date   VITAMINB12 512 12/12/2017   Lab Results  Component Value Date   TSH 2.100 12/12/2017       ASSESSMENT AND PLAN 58 y.o. year old female  has a past medical history of Basal cell carcinoma. here with  ICD-10-CM   1. Neuropathy  G62.9  Methylmalonic Acid, Serum  2. B12 deficiency  E53.8 Methylmalonic Acid, Serum  3. Vitamin D deficiency  E55.9 Vitamin D, 25-hydroxy    Sonya Hunt is doing very well on B12 supplementation.  We will continue current therapy.  We will check labs today to ensure adequate treatment.  She was advised to continue B12 supplement as well as vitamin D supplement.  I have encouraged Sonya Hunt to consider a wellness visit with a primary care provider.  She verbalizes understanding and agreement with this plan.  She will follow-up annually.   Orders Placed This Encounter  Procedures  . Methylmalonic Acid, Serum  . Vitamin D, 25-hydroxy     No orders of the defined types were placed in this encounter.     I spent 15 minutes with the patient. 50% of this time was spent counseling and educating patient on plan of care and medications.    Debbora Presto, FNP-C 04/03/2019, 12:59 PM [facility] [address] [place], [place] [address] [phone]

## 2019-04-02 NOTE — Patient Instructions (Signed)
Continue B12 supplementation  Continue Vitamin D Supplements  Consider establishing care with PCP  Will update labs  Annual follow up  Neuropathic Pain Neuropathic pain is pain caused by damage to the nerves that are responsible for certain sensations in your body (sensory nerves). The pain can be caused by:  Damage to the sensory nerves that send signals to your spinal cord and brain (peripheral nervous system).  Damage to the sensory nerves in your brain or spinal cord (central nervous system). Neuropathic pain can make you more sensitive to pain. Even a minor sensation can feel very painful. This is usually a long-term condition that can be difficult to treat. The type of pain differs from person to person. It may:  Start suddenly (acute), or it may develop slowly and last for a long time (chronic).  Come and go as damaged nerves heal, or it may stay at the same level for years.  Cause emotional distress, loss of sleep, and a lower quality of life. What are the causes? The most common cause of this condition is diabetes. Many other diseases and conditions can also cause neuropathic pain. Causes of neuropathic pain can be classified as:  Toxic. This is caused by medicines and chemicals. The most common cause of toxic neuropathic pain is damage from cancer treatments (chemotherapy).  Metabolic. This can be caused by: ? Diabetes. This is the most common disease that damages the nerves. ? Lack of vitamin B from long-term alcohol abuse.  Traumatic. Any injury that cuts, crushes, or stretches a nerve can cause damage and pain. A common example is feeling pain after losing an arm or leg (phantom limb pain).  Compression-related. If a sensory nerve gets trapped or compressed for a long period of time, the blood supply to the nerve can be cut off.  Vascular. Many blood vessel diseases can cause neuropathic pain by decreasing blood supply and oxygen to nerves.  Autoimmune. This type of  pain results from diseases in which the body's defense system (immune system) mistakenly attacks sensory nerves. Examples of autoimmune diseases that can cause neuropathic pain include lupus and multiple sclerosis.  Infectious. Many types of viral infections can damage sensory nerves and cause pain. Shingles infection is a common cause of this type of pain.  Inherited. Neuropathic pain can be a symptom of many diseases that are passed down through families (genetic). What increases the risk? You are more likely to develop this condition if:  You have diabetes.  You smoke.  You drink too much alcohol.  You are taking certain medicines, including medicines that kill cancer cells (chemotherapy) or that treat immune system disorders. What are the signs or symptoms? The main symptom is pain. Neuropathic pain is often described as:  Burning.  Shock-like.  Stinging.  Hot or cold.  Itching. How is this diagnosed? No single test can diagnose neuropathic pain. It is diagnosed based on:  Physical exam and your symptoms. Your health care provider will ask you about your pain. You may be asked to use a pain scale to describe how bad your pain is.  Tests. These may be done to see if you have a high sensitivity to pain and to help find the cause and location of any sensory nerve damage. They include: ? Nerve conduction studies to test how well nerve signals travel through your sensory nerves (electrodiagnostic testing). ? Stimulating your sensory nerves through electrodes on your skin and measuring the response in your spinal cord and brain (somatosensory evoked  potential).  Imaging studies, such as: ? X-rays. ? CT scan. ? MRI. How is this treated? Treatment for neuropathic pain may change over time. You may need to try different treatment options or a combination of treatments. Some options include:  Treating the underlying cause of the neuropathy, such as diabetes, kidney disease, or  vitamin deficiencies.  Stopping medicines that can cause neuropathy, such as chemotherapy.  Medicine to relieve pain. Medicines may include: ? Prescription or over-the-counter pain medicine. ? Anti-seizure medicine. ? Antidepressant medicines. ? Pain-relieving patches that are applied to painful areas of skin. ? A medicine to numb the area (local anesthetic), which can be injected as a nerve block.  Transcutaneous nerve stimulation. This uses electrical currents to block painful nerve signals. The treatment is painless.  Alternative treatments, such as: ? Acupuncture. ? Meditation. ? Massage. ? Physical therapy. ? Pain management programs. ? Counseling. Follow these instructions at home: Medicines   Take over-the-counter and prescription medicines only as told by your health care provider.  Do not drive or use heavy machinery while taking prescription pain medicine.  If you are taking prescription pain medicine, take actions to prevent or treat constipation. Your health care provider may recommend that you: ? Drink enough fluid to keep your urine pale yellow. ? Eat foods that are high in fiber, such as fresh fruits and vegetables, whole grains, and beans. ? Limit foods that are high in fat and processed sugars, such as fried or sweet foods. ? Take an over-the-counter or prescription medicine for constipation. Lifestyle   Have a good support system at home.  Consider joining a chronic pain support group.  Do not use any products that contain nicotine or tobacco, such as cigarettes and e-cigarettes. If you need help quitting, ask your health care provider.  Do not drink alcohol. General instructions  Learn as much as you can about your condition.  Work closely with all your health care providers to find the treatment plan that works best for you.  Ask your health care provider what activities are safe for you.  Keep all follow-up visits as told by your health care  provider. This is important. Contact a health care provider if:  Your pain treatments are not working.  You are having side effects from your medicines.  You are struggling with tiredness (fatigue), mood changes, depression, or anxiety. Summary  Neuropathic pain is pain caused by damage to the nerves that are responsible for certain sensations in your body (sensory nerves).  Neuropathic pain may come and go as damaged nerves heal, or it may stay at the same level for years.  Neuropathic pain is usually a long-term condition that can be difficult to treat. Consider joining a chronic pain support group. This information is not intended to replace advice given to you by your health care provider. Make sure you discuss any questions you have with your health care provider. Document Released: 06/09/2004 Document Revised: 01/03/2019 Document Reviewed: 09/29/2017 Elsevier Patient Education  [place]. Cyanocobalamin, Vitamin B12 injection What is this medicine? CYANOCOBALAMIN (sye an oh koe BAL a min) is a man made form of vitamin B12. Vitamin B12 is used in the growth of healthy blood cells, nerve cells, and proteins in the body. It also helps with the metabolism of fats and carbohydrates. This medicine is used to treat people who can not absorb vitamin B12. This medicine may be used for other purposes; ask your health care provider or pharmacist if you have questions.  COMMON BRAND NAME(S): B-12 Compliance Kit, B-12 Injection Kit, Cyomin, LA-12, Nutri-Twelve, Physicians EZ Use B-12, Primabalt What should I tell my health care provider before I take this medicine? They need to know if you have any of these conditions:  kidney disease  Leber's disease  megaloblastic anemia  an unusual or allergic reaction to cyanocobalamin, cobalt, other medicines, foods, dyes, or preservatives  pregnant or trying to get pregnant  breast-feeding How should I use this medicine? This medicine is  injected into a muscle or deeply under the skin. It is usually given by a health care professional in a clinic or doctor's office. However, your doctor may teach you how to inject yourself. Follow all instructions. Talk to your pediatrician regarding the use of this medicine in children. Special care may be needed. Overdosage: If you think you have taken too much of this medicine contact a poison control center or emergency room at once. NOTE: This medicine is only for you. Do not share this medicine with others. What if I miss a dose? If you are given your dose at a clinic or doctor's office, call to reschedule your appointment. If you give your own injections and you miss a dose, take it as soon as you can. If it is almost time for your next dose, take only that dose. Do not take double or extra doses. What may interact with this medicine?  colchicine  heavy alcohol intake This list may not describe all possible interactions. Give your health care provider a list of all the medicines, herbs, non-prescription drugs, or dietary supplements you use. Also tell them if you smoke, drink alcohol, or use illegal drugs. Some items may interact with your medicine. What should I watch for while using this medicine? Visit your doctor or health care professional regularly. You may need blood work done while you are taking this medicine. You may need to follow a special diet. Talk to your doctor. Limit your alcohol intake and avoid smoking to get the best benefit. What side effects may I notice from receiving this medicine? Side effects that you should report to your doctor or health care professional as soon as possible:  allergic reactions like skin rash, itching or hives, swelling of the face, lips, or tongue  blue tint to skin  chest tightness, pain  difficulty breathing, wheezing  dizziness  red, swollen painful area on the leg Side effects that usually do not require medical attention (report  to your doctor or health care professional if they continue or are bothersome):  diarrhea  headache This list may not describe all possible side effects. Call your doctor for medical advice about side effects. You may report side effects to FDA at 1-800-FDA-1088. Where should I keep my medicine? Keep out of the reach of children. Store at room temperature between 15 and 30 degrees C (59 and 85 degrees F). Protect from light. Throw away any unused medicine after the expiration date. NOTE: This sheet is a summary. It may not cover all possible information. If you have questions about this medicine, talk to your doctor, pharmacist, or health care provider.  2020 Elsevier/Gold Standard (2007-12-24 22:10:20) Vitamin D Deficiency Vitamin D deficiency is when your body does not have enough vitamin D. Vitamin D is important to your body because:  It helps your body use other minerals.  It helps to keep your bones strong and healthy.  It may help to prevent some diseases.  It helps your heart and other muscles  work well. Not getting enough vitamin D can make your bones soft. It can also cause other health problems. What are the causes? This condition may be caused by:  Not eating enough foods that contain vitamin D.  Not getting enough sun.  Having diseases that make it hard for your body to absorb vitamin D.  Having a surgery in which a part of the stomach or a part of the small intestine is removed.  Having kidney disease or liver disease. What increases the risk? You are more likely to get this condition if:  You are older.  You do not spend much time outdoors.  You live in a nursing home.  You have had broken bones.  You have weak or thin bones (osteoporosis).  You have a disease or condition that changes how your body absorbs vitamin D.  You have dark skin.  You take certain medicines.  You are overweight or obese. What are the signs or symptoms?  In mild cases,  there may not be any symptoms. If the condition is very bad, symptoms may include: ? Bone pain. ? Muscle pain. ? Falling often. ? Broken bones caused by a minor injury. How is this treated? Treatment may include taking supplements as told by your doctor. Your doctor will tell you what dose is best for you. Supplements may include:  Vitamin D.  Calcium. Follow these instructions at home: Eating and drinking   Eat foods that contain vitamin D, such as: ? Dairy products, cereals, or juices with added vitamin D. Check the label. ? Fish, such as salmon or trout. ? Eggs. ? Oysters. ? Mushrooms. The items listed above may not be a complete list of what you can eat and drink. Contact a dietitian for more options. General instructions  Take medicines and supplements only as told by your doctor.  Get regular, safe exposure to natural sunlight.  Do not use a tanning bed.  Maintain a healthy weight. Lose weight if needed.  Keep all follow-up visits as told by your doctor. This is important. How is this prevented?  You can get vitamin D by: ? Eating foods that naturally contain vitamin D. ? Eating or drinking products that have vitamin D added to them, such as cereals, juices, and milk. ? Taking vitamin D or a multivitamin that contains vitamin D. ? Being in the sun. Your body makes vitamin D when your skin is exposed to sunlight. Your body changes the sunlight into a form of the vitamin that it can use. Contact a doctor if:  Your symptoms do not go away.  You feel sick to your stomach (nauseous).  You throw up (vomit).  You poop less often than normal, or you have trouble pooping (constipation). Summary  Vitamin D deficiency is when your body does not have enough vitamin D.  Vitamin D helps to keep your bones strong and healthy.  This condition is often treated by taking a supplement.  Your doctor will tell you what dose is best for you. This information is not intended  to replace advice given to you by your health care provider. Make sure you discuss any questions you have with your health care provider. Document Released: 09/01/2011 Document Revised: 05/21/2018 Document Reviewed: 05/21/2018 Elsevier Patient Education  2020 [employer].

## 2019-04-03 ENCOUNTER — Encounter: Payer: Self-pay | Admitting: Family Medicine

## 2019-04-03 NOTE — Progress Notes (Signed)
Made any corrections needed, and agree with history, physical, neuro exam,assessment and plan as stated.     Janijah Symons, MD [facility]  

## 2019-04-07 LAB — VITAMIN D 25 HYDROXY (VIT D DEFICIENCY, FRACTURES): Vit D, 25-Hydroxy: 39.1 ng/mL (ref 30.0–100.0)

## 2019-04-07 LAB — METHYLMALONIC ACID, SERUM: Methylmalonic Acid: 144 nmol/L (ref 0–378)

## 2019-04-15 ENCOUNTER — Telehealth: Payer: Self-pay | Admitting: *Deleted

## 2019-04-15 NOTE — Telephone Encounter (Signed)
-----   Message from Melvenia Beam, MD sent at 04/14/2019  7:58 PM EDT ----- Labs normal thanks

## 2019-04-15 NOTE — Telephone Encounter (Signed)
Spoke to pt and relayed that her lab results were normal.  No lipid panel done.  Pt verbalized understanding.

## 2019-05-17 ENCOUNTER — Encounter: Payer: Self-pay | Admitting: Emergency Medicine

## 2019-05-17 ENCOUNTER — Other Ambulatory Visit: Payer: Self-pay

## 2019-05-17 ENCOUNTER — Ambulatory Visit
Admission: EM | Admit: 2019-05-17 | Discharge: 2019-05-17 | Disposition: A | Discharge status: Home / Self Care | Payer: Managed Care, Other (non HMO) | Attending: Physician Assistant | Admitting: Physician Assistant

## 2019-05-17 DIAGNOSIS — B86 Scabies: Secondary | ICD-10-CM | POA: Diagnosis not present

## 2019-05-17 DIAGNOSIS — R21 Rash and other nonspecific skin eruption: Secondary | ICD-10-CM | POA: Diagnosis not present

## 2019-05-17 MED ORDER — PREDNISONE 50 MG PO TABS: 50.0000 mg | ORAL_TABLET | Freq: Every day | ORAL | 0 refills | Status: DC

## 2019-05-17 MED ORDER — PERMETHRIN 5 % EX CREA: TOPICAL_CREAM | CUTANEOUS | 0 refills | Status: DC

## 2019-05-17 NOTE — Discharge Instructions (Addendum)
Start permethrin as directed. Prednisone to help with itching and cover for any inflammatory causes of rash. Follow directions on attachment for cleaning sheets/clothes. Follow up for reevaluation if symptoms not improving.

## 2019-05-17 NOTE — ED Provider Notes (Signed)
[name]    CSN: [id number] Arrival date & time: 05/17/19  1455      History   Chief Complaint Chief Complaint  Patient presents with  . Rash    HPI Sonya Hunt is a 58 y.o. female.   58 year old female comes in for 5-day history of rash to the fingers, webspace.  Area is itching in nature, worse at nighttime.  Denies erythema, warmth, pain.  Denies new contacts/hygiene product change. Denies itching to the groin, abdomen, underarms. She has had history of scabies, and has been told in the past that it could be related to her gardening.  Of note, she stated a hotel 6 days ago prior to symptom onset. Has not tried anything for the symptoms.      Past Medical History:  Diagnosis Date  . Basal cell carcinoma     Patient Active Problem List   Diagnosis Date Noted  . B12 deficiency 09/14/2015  . Neuropathy 09/14/2015  . Paresthesias 06/09/2014  . Tachycardia 05/15/2014    Past Surgical History:  Procedure Laterality Date  . APPENDECTOMY  1992  . Excision of BCC R arm      OB History   No obstetric history on file.      Home Medications    Prior to Admission medications   Medication Sig Start Date End Date Taking? Authorizing Provider  atorvastatin (LIPITOR) 10 MG tablet TAKE 1 TABLET (10 MG TOTAL) BY MOUTH DAILY AT 6 PM. 03/15/19   Melvenia Beam, MD  cyanocobalamin 1000 MCG tablet Take 1,000 mcg by mouth every other day.    [provider]  permethrin (ELIMITE) 5 % cream Thoroughly massage cream (30 g for average adult) from head to soles of feet; leave on for 8 to 14 hours before removing (shower or bath), may repeat if living mites are observed 14 days after first treatment; one application is generally curative. 05/17/19   Tasia Catchings, Amy V, PA-C  predniSONE (DELTASONE) 50 MG tablet Take 1 tablet (50 mg total) by mouth daily with breakfast. 05/17/19   Tasia Catchings, Amy V, PA-C  pregabalin (LYRICA) 50 MG capsule TAKE 1 CAPSULE BY MOUTH DAILY 02/06/19    Melvenia Beam, MD  Vitamin D, Ergocalciferol, (DRISDOL) 50000 units CAPS capsule Take 1 capsule (50,000 Units total) by mouth every 7 (seven) days. 12/19/17   Melvenia Beam, MD    Family History Family History  Problem Relation Age of Onset  . Heart disease Mother   . Hypertension Mother   . Prostate cancer Father   . Other Father        MDS  . Hypertension Brother   . Hypertension Son     Social History Social History   Tobacco Use  . Smoking status: Never Smoker  . Smokeless tobacco: Never Used  Substance Use Topics  . Alcohol use: No  . Drug use: No     Allergies   Patient has no known allergies.   Review of Systems Review of Systems  Reason unable to perform ROS: See HPI as above.     Physical Exam Triage Vital Signs ED Triage Vitals  Enc Vitals Group     BP 05/17/19 1504 126/85     Pulse Rate 05/17/19 1504 69     Resp 05/17/19 1504 18     Temp 05/17/19 1504 (!) 97.5 F (36.4 C)     Temp Source 05/17/19 1504 Oral     SpO2 05/17/19 1504 99 %  Weight --      Height --      Head Circumference --      Peak Flow --      Pain Score 05/17/19 1505 0     Pain Loc --      Pain Edu? --      Excl. in [place]? --    No data found.  Updated Vital Signs BP 126/85 (BP Location: Left Arm)   Pulse 69   Temp (!) 97.5 F (36.4 C) (Oral)   Resp 18   SpO2 99%   Physical Exam Constitutional:      General: She is not in acute distress.    Appearance: She is well-developed. She is not diaphoretic.  HENT:     Head: Normocephalic and atraumatic.  Eyes:     Conjunctiva/sclera: Conjunctivae normal.     Pupils: Pupils are equal, round, and reactive to light.  Pulmonary:     Effort: Pulmonary effort is normal. No respiratory distress.  Skin:    General: Skin is warm and dry.     Comments: Multiple small papules along sides of fingers. No erythema, excoriation. No vesicles. No burrowing. No tenderness.   Neurological:     Mental Status: She is alert and  oriented to person, place, and time.      UC Treatments / Results  Labs (all labs ordered are listed, but only abnormal results are displayed) Labs Reviewed - No data to display  EKG   Radiology No results found.  Procedures Procedures (including critical care time)  Medications Ordered in UC Medications - No data to display  Initial Impression / Assessment and Plan / UC Course  I have reviewed the triage vital signs and the nursing notes.  Pertinent labs & imaging results that were available during my care of the patient were reviewed by me and considered in my medical decision making (see chart for details).    Will treat for scabies given location of rash. Start permethrin as directed. Prednisone for symptomatic treatment. Return precautions given. Patient expresses understanding and agrees to plan.  Final Clinical Impressions(s) / UC Diagnoses   Final diagnoses:  Rash  Scabies   ED Prescriptions    Medication Sig Dispense Auth. Provider   permethrin (ELIMITE) 5 % cream Thoroughly massage cream (30 g for average adult) from head to soles of feet; leave on for 8 to 14 hours before removing (shower or bath), may repeat if living mites are observed 14 days after first treatment; one application is generally curative. 60 g Yu, Amy V, PA-C   predniSONE (DELTASONE) 50 MG tablet Take 1 tablet (50 mg total) by mouth daily with breakfast. 5 tablet Tobin Chad, [place] 05/17/19 1532

## 2019-05-17 NOTE — ED Notes (Signed)
Patient able to ambulate independently  

## 2019-05-17 NOTE — ED Triage Notes (Signed)
Pt presents to [facility] for assessment of bilateral hand rash starting MOnday.  States it is severely itchy, but really only at night.  Hx of scabies.

## 2019-05-20 ENCOUNTER — Other Ambulatory Visit: Payer: Self-pay | Admitting: Neurology

## 2019-05-20 NOTE — Telephone Encounter (Signed)
Lyrica rx faxed to pharmacy. Received a receipt of confirmation.

## 2019-06-12 ENCOUNTER — Other Ambulatory Visit: Payer: Self-pay | Admitting: Neurology

## 2019-06-25 ENCOUNTER — Encounter: Payer: Self-pay | Admitting: Emergency Medicine

## 2019-06-25 ENCOUNTER — Ambulatory Visit
Admission: EM | Admit: 2019-06-25 | Discharge: 2019-06-25 | Disposition: A | Discharge status: Home / Self Care | Payer: Managed Care, Other (non HMO) | Attending: Physician Assistant | Admitting: Physician Assistant

## 2019-06-25 ENCOUNTER — Other Ambulatory Visit: Payer: Self-pay

## 2019-06-25 DIAGNOSIS — M545 Low back pain, unspecified: Secondary | ICD-10-CM

## 2019-06-25 MED ORDER — METHOCARBAMOL 500 MG PO TABS: 500.0000 mg | ORAL_TABLET | Freq: Two times a day (BID) | ORAL | 0 refills | Status: DC

## 2019-06-25 MED ORDER — MELOXICAM 7.5 MG PO TABS: 7.5000 mg | ORAL_TABLET | Freq: Every day | ORAL | 0 refills | Status: DC

## 2019-06-25 NOTE — Discharge Instructions (Signed)
Start Mobic. Do not take ibuprofen (motrin/advil)/ naproxen (aleve) while on mobic. Robaxin as needed, this can make you drowsy, so do not take if you are going to drive, operate heavy machinery, or make important decisions. Ice/heat compresses as needed. This can take up to 3-4 weeks to completely resolve, but you should be feeling better each week. Follow up here or with PCP if symptoms worsen, changes for reevaluation. If experience numbness/tingling of the inner thighs, loss of bladder or bowel control, go to the emergency department for evaluation.  ° °

## 2019-06-25 NOTE — ED Provider Notes (Signed)
[name]    CSN: [id number] Arrival date & time: 06/25/19  1534      History   Chief Complaint Chief Complaint  Patient presents with  . Back Pain    HPI Sonya Hunt is a 58 y.o. female.   58 year old female comes in for 1 day history of low back pain.  Patient states this happened after lifting a potted plant yesterday, causing sudden pain to the right lower back.  States since then, has had dull aching pain to the right lower back that is constant, worse with certain movements.  Denies radiation of pain, saddle anesthesia, loss of bladder or bowel control.  Has tried Advil without relief.     Past Medical History:  Diagnosis Date  . Basal cell carcinoma     Patient Active Problem List   Diagnosis Date Noted  . B12 deficiency 09/14/2015  . Neuropathy 09/14/2015  . Paresthesias 06/09/2014  . Tachycardia 05/15/2014    Past Surgical History:  Procedure Laterality Date  . APPENDECTOMY  1992  . Excision of BCC R arm      OB History   No obstetric history on file.      Home Medications    Prior to Admission medications   Medication Sig Start Date End Date Taking? Authorizing Provider  atorvastatin (LIPITOR) 10 MG tablet TAKE 1 TABLET (10 MG TOTAL) BY MOUTH DAILY AT 6 PM. 06/12/19   Melvenia Beam, MD  cyanocobalamin 1000 MCG tablet Take 1,000 mcg by mouth every other day.    [provider]  meloxicam (MOBIC) 7.5 MG tablet Take 1 tablet (7.5 mg total) by mouth daily. 06/25/19   Tasia Catchings, Jahmiyah Dullea V, PA-C  methocarbamol (ROBAXIN) 500 MG tablet Take 1 tablet (500 mg total) by mouth 2 (two) times daily. 06/25/19   Tasia Catchings, Nevyn Bossman V, PA-C  permethrin (ELIMITE) 5 % cream Thoroughly massage cream (30 g for average adult) from head to soles of feet; leave on for 8 to 14 hours before removing (shower or bath), may repeat if living mites are observed 14 days after first treatment; one application is generally curative. 05/17/19   Tasia Catchings, Nusayba Cadenas V, PA-C  pregabalin (LYRICA)  50 MG capsule TAKE 1 CAPSULE BY MOUTH DAILY 05/20/19   Melvenia Beam, MD  Vitamin D, Ergocalciferol, (DRISDOL) 50000 units CAPS capsule Take 1 capsule (50,000 Units total) by mouth every 7 (seven) days. 12/19/17   Melvenia Beam, MD    Family History Family History  Problem Relation Age of Onset  . Heart disease Mother   . Hypertension Mother   . Prostate cancer Father   . Other Father        MDS  . Hypertension Brother   . Hypertension Son     Social History Social History   Tobacco Use  . Smoking status: Never Smoker  . Smokeless tobacco: Never Used  Substance Use Topics  . Alcohol use: No  . Drug use: No     Allergies   Patient has no known allergies.   Review of Systems Review of Systems  Reason unable to perform ROS: See HPI as above.     Physical Exam Triage Vital Signs ED Triage Vitals  Enc Vitals Group     BP 06/25/19 1545 137/80     Pulse Rate 06/25/19 1545 83     Resp 06/25/19 1545 16     Temp 06/25/19 1545 98.4 F (36.9 C)     Temp Source 06/25/19 1545  Oral     SpO2 06/25/19 1545 96 %     Weight --      Height --      Head Circumference --      Peak Flow --      Pain Score 06/25/19 1547 3     Pain Loc --      Pain Edu? --      Excl. in [place]? --    No data found.  Updated Vital Signs BP 137/80 (BP Location: Left Arm)   Pulse 83   Temp 98.4 F (36.9 C) (Oral)   Resp 16   SpO2 96%   Physical Exam Constitutional:      General: She is not in acute distress.    Appearance: She is well-developed. She is not diaphoretic.  HENT:     Head: Normocephalic and atraumatic.  Eyes:     Conjunctiva/sclera: Conjunctivae normal.     Pupils: Pupils are equal, round, and reactive to light.  Cardiovascular:     Rate and Rhythm: Normal rate and regular rhythm.     Heart sounds: Normal heart sounds. No murmur. No friction rub. No gallop.   Pulmonary:     Effort: Pulmonary effort is normal. No accessory muscle usage or respiratory distress.      Breath sounds: Normal breath sounds. No stridor. No decreased breath sounds, wheezing, rhonchi or rales.  Musculoskeletal:     Comments: No swelling, rash, erythema, warmth, contusion.  No tenderness on palpation of the spinous processes.  No tenderness to palpation of bilateral lateral lower back.  Full range of motion of back and hips.  Pain to the right lumbar region during range of motion of right hip.  Sensation intact and equal bilaterally.  Negative straight leg raise.  Skin:    General: Skin is warm and dry.  Neurological:     Mental Status: She is alert and oriented to person, place, and time.    UC Treatments / Results  Labs (all labs ordered are listed, but only abnormal results are displayed) Labs Reviewed - No data to display  EKG   Radiology No results found.  Procedures Procedures (including critical care time)  Medications Ordered in UC Medications - No data to display  Initial Impression / Assessment and Plan / UC Course  I have reviewed the triage vital signs and the nursing notes.  Pertinent labs & imaging results that were available during my care of the patient were reviewed by me and considered in my medical decision making (see chart for details).    Start NSAID as directed for pain and inflammation. Muscle relaxant as needed. Ice/heat compresses. Discussed with patient strain can take up to 3-4 weeks to resolve, but should be getting better each week. Return precautions given.   Final Clinical Impressions(s) / UC Diagnoses   Final diagnoses:  Acute right-sided low back pain without sciatica   ED Prescriptions    Medication Sig Dispense Auth. Provider   meloxicam (MOBIC) 7.5 MG tablet Take 1 tablet (7.5 mg total) by mouth daily. 10 tablet Sharon Rubis V, PA-C   methocarbamol (ROBAXIN) 500 MG tablet Take 1 tablet (500 mg total) by mouth 2 (two) times daily. 20 tablet Ok Edwards, PA-C     PDMP not reviewed this encounter.   Ok Edwards, PA-C 06/25/19 1735

## 2019-06-25 NOTE — ED Notes (Signed)
Patient able to ambulate independently  

## 2019-06-25 NOTE — ED Triage Notes (Addendum)
Pt presents to [facility] for assessment of 24 hour lower back pain, worse on the right side, but stretches across both sides.  Patient states she was picking up a potted plant yesterday when she felt the sudden onset of the pain, denies a "pop" or anything.  Patient states standing up from sitting or bending over is where she has the most trouble/increase in pain.

## 2019-07-29 ENCOUNTER — Other Ambulatory Visit: Payer: Self-pay | Admitting: Neurology

## 2019-07-29 ENCOUNTER — Telehealth: Payer: Self-pay | Admitting: Family Medicine

## 2019-07-29 IMAGING — CT CT HEAD W/O CM
3 series · 15 of 47 positions shown, 18 images · non-contrast
Comparison: None.

CLINICAL DATA: Dizziness.  Vomiting and elevated blood pressure.

EXAM:
CT HEAD WITHOUT CONTRAST
TECHNIQUE: Contiguous axial images were obtained from the base of the skull
through the vertex without intravenous contrast.

[Series 2: head wo · axial · 0.46mm/px · z∈[-172,-32]mm · 9 of 34 slices shown, 12 images]
[im 3/34  brain]
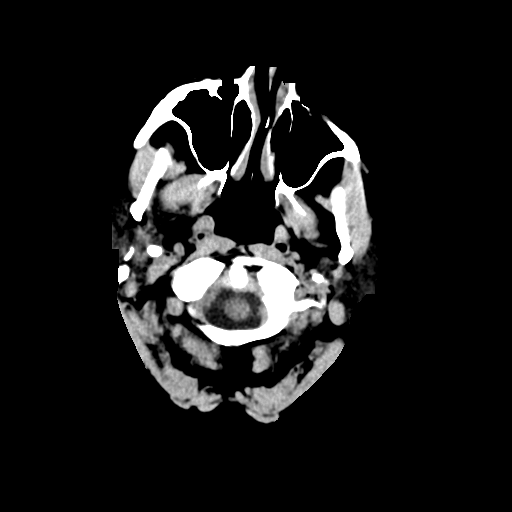
[im 3/34  bone]
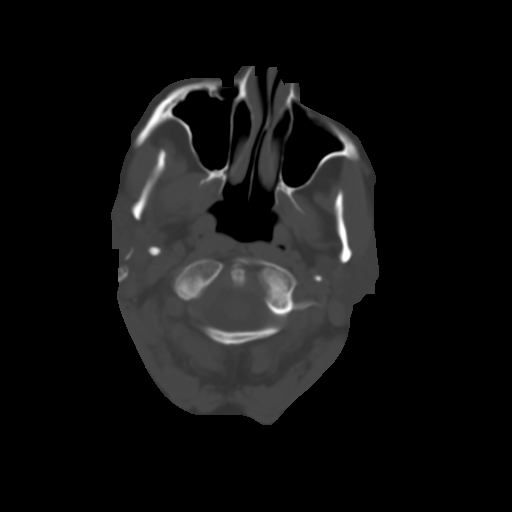
[im 6/34  brain]
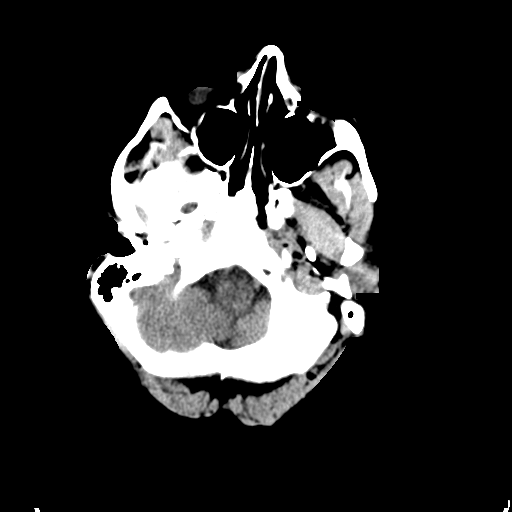
[im 10/34  brain]
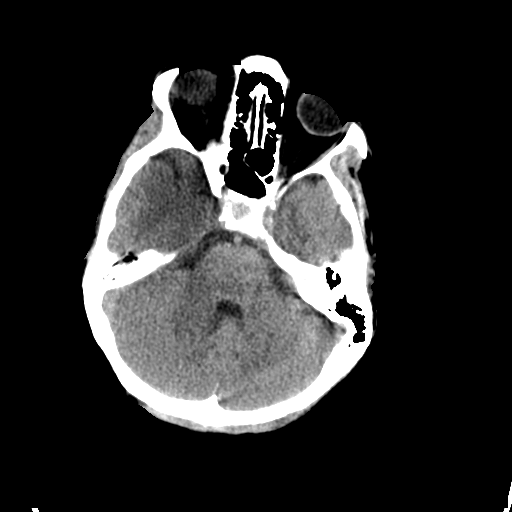
[im 13/34  brain]
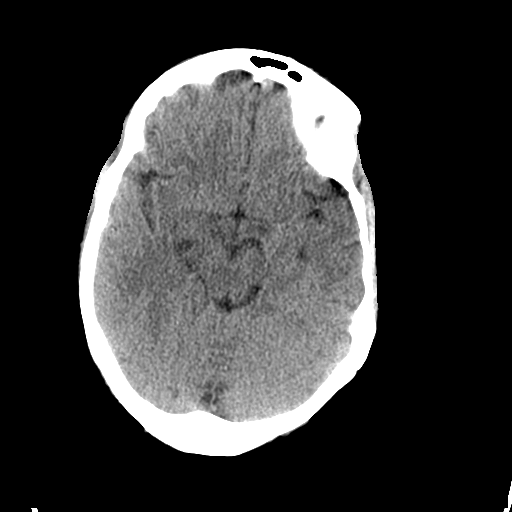
[im 18/34  brain]
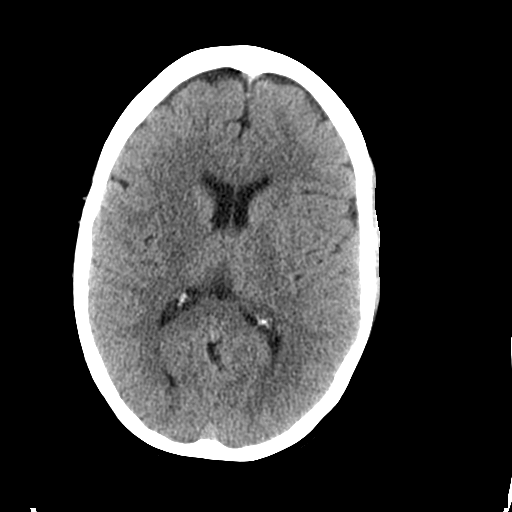
[im 18/34  bone]
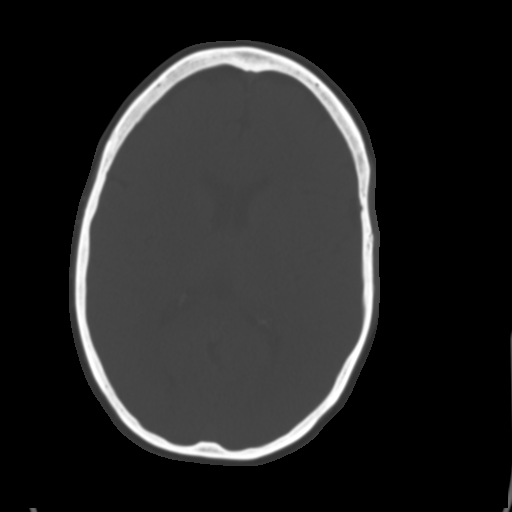
[im 21/34  brain]
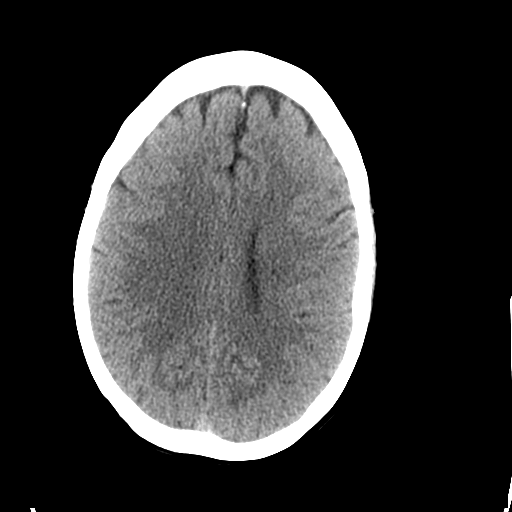
[im 24/34  brain]
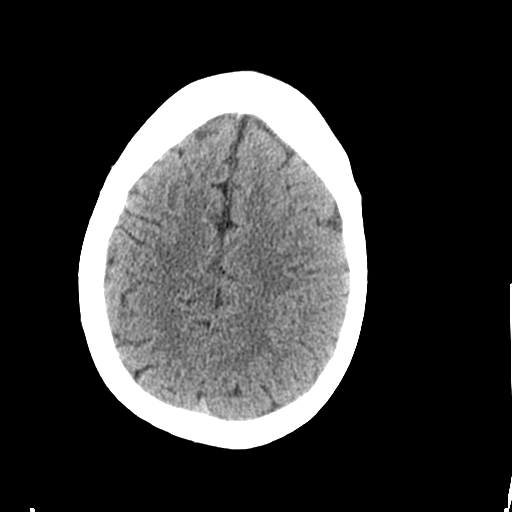
[im 28/34  brain]
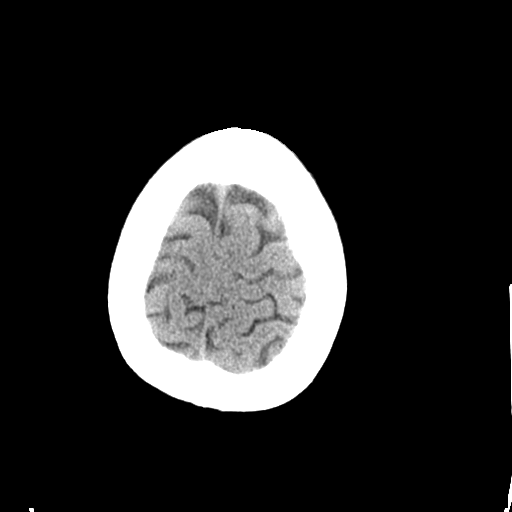
[im 31/34  brain]
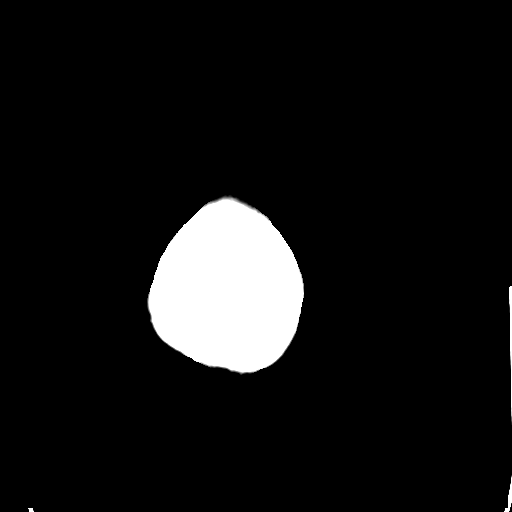
[im 31/34  bone]
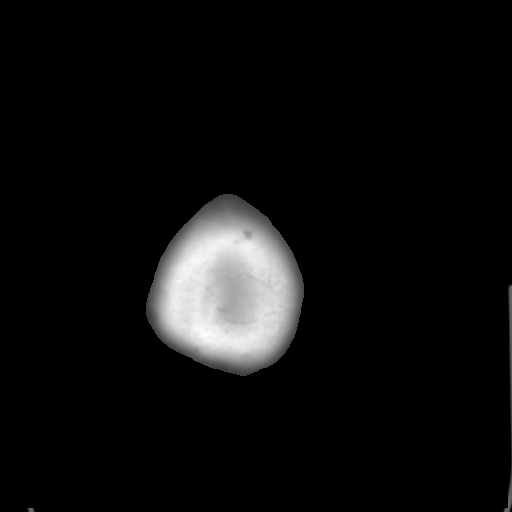

[Series 5: coronal soft tissue · coronal · 0.31mm/px · 3 of 78 slices shown]
[im 26/78  brain]
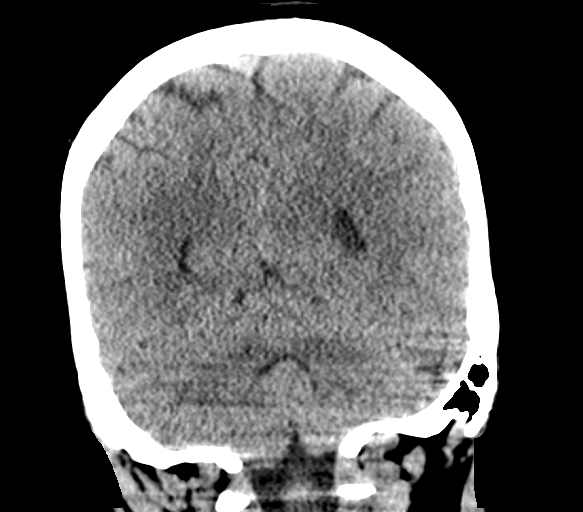
[im 35/78  brain]
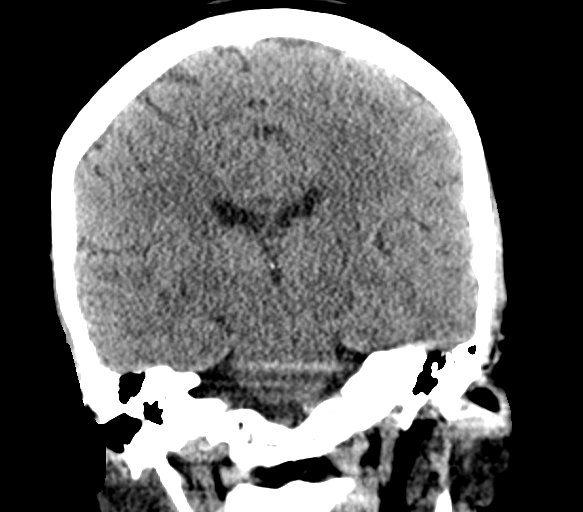
[im 43/78  brain]
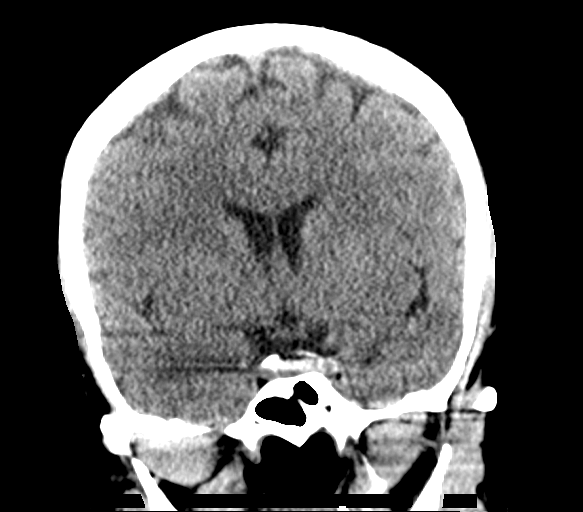

[Series 6: sagittal soft tissue · sagittal · 0.33mm/px · 3 of 71 slices shown]
[im 24/71  brain]
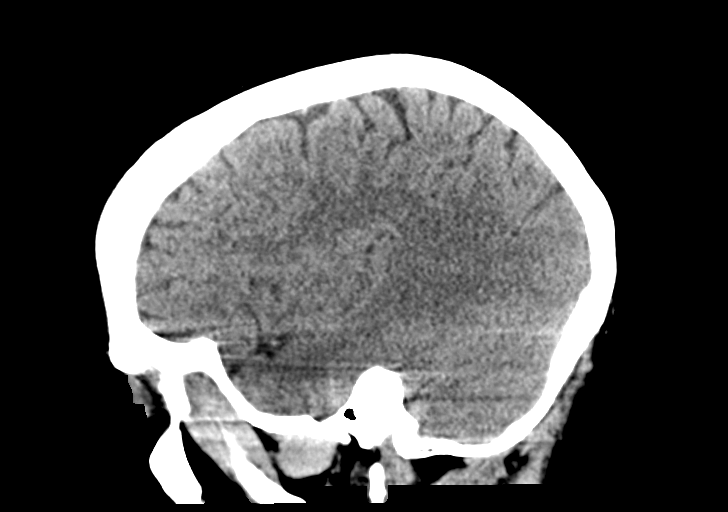
[im 36/71  brain]
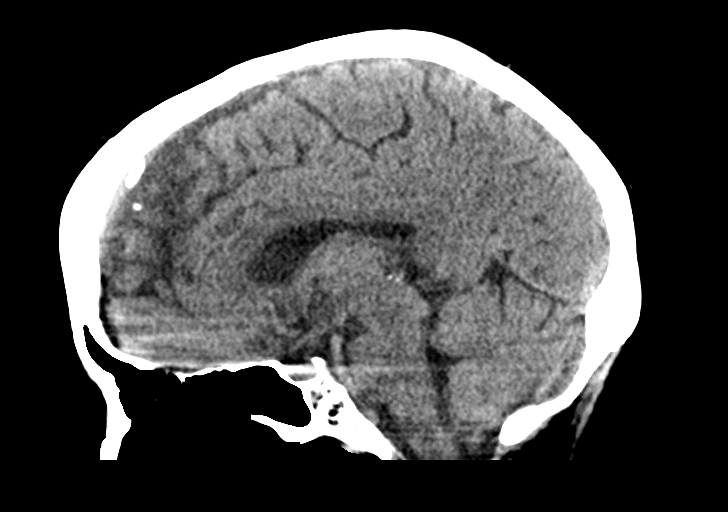
[im 47/71  brain]
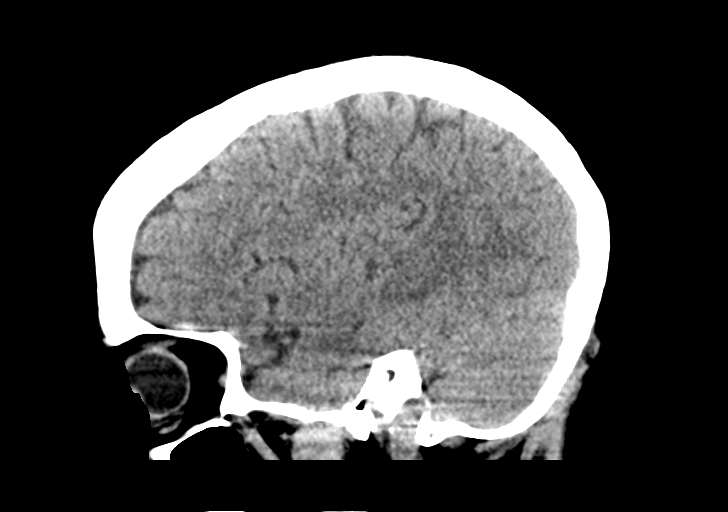

[15 of 47 positions shown; findings below may reference images not displayed]

FINDINGS: Brain: No mass lesion, hemorrhage, hydrocephalus, acute infarct,
intra-axial, or extra-axial fluid collection.

Vascular: No hyperdense vessel or unexpected calcification.

Skull: Normal

Sinuses/Orbits: Normal imaged portions of the orbits and globes.
Clear paranasal sinuses and mastoid air cells.

Other: None.
IMPRESSION: Normal head CT.

## 2019-07-29 NOTE — Telephone Encounter (Signed)
Pt called stating that she wants her pregabalin (LYRICA) 50 MG capsule prescription sent to the [facility] on [address]. Because it was sent to [place] and "they were not wearing masks" when she went to pick it up so she does not want her medications to be sent there. Please advise.

## 2019-07-29 NOTE — Telephone Encounter (Signed)
[place], she probably needs to have it transferred to the new pharmacy. Would you ask her to call and have it transferred? For insurance purposes thanks

## 2019-07-30 ENCOUNTER — Other Ambulatory Visit: Payer: Self-pay | Admitting: Neurology

## 2019-07-30 MED ORDER — PREGABALIN 50 MG PO CAPS: 50.0000 mg | ORAL_CAPSULE | Freq: Every day | ORAL | 5 refills | Status: DC

## 2019-07-30 NOTE — Telephone Encounter (Signed)
Done. thanks

## 2019-09-29 ENCOUNTER — Other Ambulatory Visit: Payer: Self-pay | Admitting: Neurology

## 2019-12-27 ENCOUNTER — Ambulatory Visit
Admission: EM | Admit: 2019-12-27 | Discharge: 2019-12-27 | Disposition: A | Discharge status: Home / Self Care | Payer: Commercial Managed Care - PPO | Attending: Physician Assistant | Admitting: Physician Assistant

## 2019-12-27 ENCOUNTER — Ambulatory Visit (INDEPENDENT_AMBULATORY_CARE_PROVIDER_SITE_OTHER): Payer: Commercial Managed Care - PPO

## 2019-12-27 ENCOUNTER — Encounter: Payer: Self-pay | Admitting: Emergency Medicine

## 2019-12-27 ENCOUNTER — Other Ambulatory Visit: Payer: Self-pay

## 2019-12-27 DIAGNOSIS — S92525A Nondisplaced fracture of medial phalanx of left lesser toe(s), initial encounter for closed fracture: Secondary | ICD-10-CM

## 2019-12-27 DIAGNOSIS — W228XXA Striking against or struck by other objects, initial encounter: Secondary | ICD-10-CM | POA: Diagnosis not present

## 2019-12-27 DIAGNOSIS — M79672 Pain in left foot: Secondary | ICD-10-CM

## 2019-12-27 HISTORY — DX: Pure hypercholesterolemia, unspecified: E78.00

## 2019-12-27 NOTE — Discharge Instructions (Signed)
As discussed, left toe fracture on xray. Ibuprofen 800mg  three times a day, tylenol 1000mg  three times a day as needed for pain. Ice compress, elevation. Expect 6-8 week healing processes. Follow up with PCP/sports medicine if symptoms worsening/ not improving.

## 2019-12-27 NOTE — ED Provider Notes (Signed)
[name]    CSN: [id number] Arrival date & time: 12/27/19  1610      History   Chief Complaint Chief Complaint  Patient presents with  . Toe Pain    HPI Sonya Hunt is a 59 y.o. female.   59 year old female comes in for left 5th toe pain after injury last night. States jammed toe last night. Has mild swelling without erythema, contusion, warmth. Painful palpation. Denies numbness/tingling. States has history of fractures to toes, and feels the same. Buddy taping with some relief.      Past Medical History:  Diagnosis Date  . Basal cell carcinoma   . Hypercholesteremia     Patient Active Problem List   Diagnosis Date Noted  . B12 deficiency 09/14/2015  . Neuropathy 09/14/2015  . Paresthesias 06/09/2014  . Tachycardia 05/15/2014    Past Surgical History:  Procedure Laterality Date  . APPENDECTOMY  1992  . Excision of BCC R arm      OB History   No obstetric history on file.      Home Medications    Prior to Admission medications   Medication Sig Start Date End Date Taking? Authorizing Provider  atorvastatin (LIPITOR) 10 MG tablet TAKE 1 TABLET (10 MG TOTAL) BY MOUTH DAILY AT 6 PM. 09/29/19  Yes Melvenia Beam, MD  cyanocobalamin 1000 MCG tablet Take 1,000 mcg by mouth every other day.   Yes [provider]  pregabalin (LYRICA) 50 MG capsule Take 1 capsule (50 mg total) by mouth daily. 07/30/19  Yes Melvenia Beam, MD  Vitamin D, Ergocalciferol, (DRISDOL) 50000 units CAPS capsule Take 1 capsule (50,000 Units total) by mouth every 7 (seven) days. 12/19/17  Yes Melvenia Beam, MD    Family History Family History  Problem Relation Age of Onset  . Heart disease Mother   . Hypertension Mother   . Prostate cancer Father   . Other Father        MDS  . Hypertension Brother   . Hypertension Son     Social History Social History   Tobacco Use  . Smoking status: Never Smoker  . Smokeless tobacco: Never Used  Substance Use  Topics  . Alcohol use: No  . Drug use: No     Allergies   Patient has no known allergies.   Review of Systems Review of Systems  Reason unable to perform ROS: See HPI as above.     Physical Exam Triage Vital Signs ED Triage Vitals [12/27/19 1617]  Enc Vitals Group     BP 140/88     Pulse Rate 86     Resp 18     Temp 98 F (36.7 C)     Temp Source Oral     SpO2 99 %     Weight 145 lb (65.8 kg)     Height 5\' 8"  (1.727 m)     Head Circumference      Peak Flow      Pain Score 6     Pain Loc      Pain Edu?      Excl. in [place]?    No data found.  Updated Vital Signs BP 140/88 (BP Location: Left Arm)   Pulse 86   Temp 98 F (36.7 C) (Oral)   Resp 18   Ht 5\' 8"  (1.727 m)   Wt 145 lb (65.8 kg)   SpO2 99%   BMI 22.05 kg/m   Visual Acuity  Right Eye Distance:   Left Eye Distance:   Bilateral Distance:    Right Eye Near:   Left Eye Near:    Bilateral Near:     Physical Exam Constitutional:      General: She is not in acute distress.    Appearance: Normal appearance. She is well-developed. She is not toxic-appearing or diaphoretic.  HENT:     Head: Normocephalic and atraumatic.  Eyes:     Conjunctiva/sclera: Conjunctivae normal.     Pupils: Pupils are equal, round, and reactive to light.  Pulmonary:     Effort: Pulmonary effort is normal. No respiratory distress.     Comments: Speaking in full sentences without difficulty Musculoskeletal:     Cervical back: Normal range of motion and neck supple.     Comments: Mild swelling to left 5th toe. Diffuse soreness on palpation of left 5th toe. NVI  Skin:    General: Skin is warm and dry.  Neurological:     Mental Status: She is alert and oriented to person, place, and time.      UC Treatments / Results  Labs (all labs ordered are listed, but only abnormal results are displayed) Labs Reviewed - No data to display  EKG   Radiology DG Foot Complete Left  Result Date: 12/27/2019 CLINICAL DATA:  Small  toe pain following injury last night. EXAM: LEFT FOOT - COMPLETE 3+ VIEW COMPARISON:  None. FINDINGS: There is suspicion of a nondisplaced fracture involving the head of the 5th proximal phalanx, although evaluation is limited by overlying artifact. There is no displaced fracture or dislocation. Mild joint space narrowing is present at the 1st metatarsophalangeal joint. No evidence of foreign body or soft tissue emphysema. IMPRESSION: Possible nondisplaced fracture of the 5th proximal phalangeal head. Correlate with area of pain. Electronically Signed   By: Richardean Sale M.D.   On: 12/27/2019 16:32    Procedures Procedures (including critical care time)  Medications Ordered in UC Medications - No data to display  Initial Impression / Assessment and Plan / UC Course  I have reviewed the triage vital signs and the nursing notes.  Pertinent labs & imaging results that were available during my care of the patient were reviewed by me and considered in my medical decision making (see chart for details).    Xray reviewed by me with left middle phalanx fracture of 5th toe. Discussed if radiology read differs, will inform patient. At this time, NSAIDS/tyenlol for pain relief. Provided post op shoe. Discussed expected course of healing. Return precautions given.  Final Clinical Impressions(s) / UC Diagnoses   Final diagnoses:  Closed nondisplaced fracture of middle phalanx of lesser toe of left foot, initial encounter    ED Prescriptions    None     PDMP not reviewed this encounter.   Ok Edwards, PA-C 12/27/19 1641

## 2019-12-27 NOTE — ED Triage Notes (Signed)
Patient states she hit her left foot on a chair in the middle of the night last night. She is c/o left pinky toe pain and is requesting an xray.

## 2019-12-31 ENCOUNTER — Other Ambulatory Visit: Payer: Self-pay | Admitting: Neurology

## 2020-01-29 ENCOUNTER — Other Ambulatory Visit: Payer: Self-pay | Admitting: Neurology

## 2020-02-27 ENCOUNTER — Other Ambulatory Visit: Payer: Self-pay | Admitting: Neurology

## 2020-03-26 ENCOUNTER — Other Ambulatory Visit: Payer: Self-pay | Admitting: Neurology

## 2020-03-26 NOTE — Telephone Encounter (Signed)
Per [place] registry, last filled on 01/23/2020 Pregabalin 50 Mg Capsule #30 for 30 day supply. Pending appt 04/15/20.

## 2020-04-07 ENCOUNTER — Ambulatory Visit: Payer: Managed Care, Other (non HMO) | Admitting: Family Medicine

## 2020-04-15 ENCOUNTER — Ambulatory Visit: Payer: Managed Care, Other (non HMO) | Admitting: Neurology

## 2020-04-18 ENCOUNTER — Other Ambulatory Visit: Payer: Self-pay | Admitting: Neurology

## 2020-05-09 ENCOUNTER — Other Ambulatory Visit: Payer: Self-pay | Admitting: Neurology

## 2020-05-12 NOTE — Telephone Encounter (Signed)
Per [place] registry, last filled on 03/26/2020 Pregabalin 50 Mg Capsule  #30.00 for 30 day supply. Pending appt with Dr Jaynee Eagles on 07/15/2020. Rx sent to Dr Jaynee Eagles to e-scribe.

## 2020-05-16 ENCOUNTER — Other Ambulatory Visit: Payer: Self-pay | Admitting: Neurology

## 2020-06-04 ENCOUNTER — Other Ambulatory Visit: Payer: Self-pay | Admitting: Neurology

## 2020-07-15 ENCOUNTER — Ambulatory Visit: Payer: Commercial Managed Care - PPO | Admitting: Neurology

## 2020-07-15 ENCOUNTER — Encounter: Payer: Self-pay | Admitting: Neurology

## 2020-07-15 ENCOUNTER — Other Ambulatory Visit: Payer: Self-pay

## 2020-07-15 ENCOUNTER — Telehealth: Payer: Self-pay | Admitting: Neurology

## 2020-07-15 VITALS — BP 118/82 | HR 82 | Ht 68.0 in | Wt 143.0 lb

## 2020-07-15 DIAGNOSIS — R292 Abnormal reflex: Secondary | ICD-10-CM

## 2020-07-15 DIAGNOSIS — R202 Paresthesia of skin: Secondary | ICD-10-CM | POA: Diagnosis not present

## 2020-07-15 DIAGNOSIS — R270 Ataxia, unspecified: Secondary | ICD-10-CM | POA: Diagnosis not present

## 2020-07-15 DIAGNOSIS — E538 Deficiency of other specified B group vitamins: Secondary | ICD-10-CM

## 2020-07-15 DIAGNOSIS — G959 Disease of spinal cord, unspecified: Secondary | ICD-10-CM | POA: Diagnosis not present

## 2020-07-15 DIAGNOSIS — G63 Polyneuropathy in diseases classified elsewhere: Secondary | ICD-10-CM

## 2020-07-15 DIAGNOSIS — E785 Hyperlipidemia, unspecified: Secondary | ICD-10-CM

## 2020-07-15 DIAGNOSIS — G1229 Other motor neuron disease: Secondary | ICD-10-CM

## 2020-07-15 MED ORDER — ALPRAZOLAM 0.25 MG PO TABS: ORAL_TABLET | ORAL | 0 refills | Status: DC

## 2020-07-15 NOTE — Telephone Encounter (Signed)
open MRI  [facility] Auth: [place] [facility] ref # [id number] on 07/15/20 faxed order to [facility] they will reach out to the patient to schedule

## 2020-07-15 NOTE — Telephone Encounter (Signed)
Prescription was already sent to pharmacy thanks

## 2020-07-15 NOTE — Telephone Encounter (Signed)
scheduled 11/9 3pm   Sonya Hunt at [facility] stated Patient requested this appointment a few weeks out because she is going out of town.  She did want to make sure Dr. Jaynee Eagles will prescribe her something because she is so claustrophobic.

## 2020-07-15 NOTE — Progress Notes (Addendum)
[facility]    Provider:  Dr Jaynee Eagles Referring Provider: No ref. 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 Doing much better. On supplementation. Extensive panel did not reveal any other etiology. She is stable, she still has numbness and tingling, it never goes away, her body always feels "strange". In all the limbs.  She has balance problems sometimes, she has numbness and tingling in the arms and legs, no neck pain.   Interval history: Initial B12 was 168.  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 Extensive panel did not reveal any other etiology.  Feeling stable. Lyrica helps with the paresthesias. Feels it impacts her mentation.  But if she doesn't take the lyrica it does feel ok.   Interval history: B12 was 168 2 years ago. Her paresthesias are improved on B12 supplementation. Discussed differential neuropathy. She still does have paresthesias but they are improved. We checked multiple labs and B12 deficiency was the only predisposing factors for neuropathy. However there are several more we can try including Sjogren's which also can cause pure sensory neuropathy. Her rheumatoid factor was minimally elevated in the past so we can recheck with CCP antibodies. Also we can check vitamin B1 space, heavy metals, hemoglobin A1c and repeat B12 and folate.  Interval history: she started taking B12 and her symptoms appeared to be improved. She still has some tingling. She still ahs paresthesias in the fingers and the toes. Discussed B12 deficiency in detail, she takes 1068mcg a day. She can increase it to 2075mcg daily. Will perform emg/ncs to assess for any other cause of paresthjesias.  HPI: Sonya Hunt is a 59 y.o. female here as a referral from Dr. Laurann Montana for Paresthesias. The week after mother's day she felt numbness, tingling in all the limbs. Started in the  right arm, like her arm was asleep. Also numbness and tingly in the lips. Started slowly and progressed. Got significantly worse in May. Went away for a week with just some paresthesias at night. Is waxing and waning. At it's height in July around the 18th she had a week of the symptoms and it was getting very intense and her heart started beating really fast, they went to the emergency room and she was tachycardic. Paresthesias went up to 8/10 in discomfort. They go away for a few weeks and then come back. Symptoms always start slowly and ascend with a strange sensation of paresthesias and numbness, always starting in the right arm. No neck pain, no back pain, no weakness. She has warm sensations, maybe entering menopause, b which tends to happen at the same time as the paresthesias. No diabetes. She works out regularly, runs. Went ot the cardiologist and EKG was normal. Right arm always more intense. No other focal symptoms, denies weakness, vision changes, speech or swallow problems. She has an overactive bladder. Nothing seems to make symptoms worse or trigger them, better if walking around or moving. No SOB. Sometimes when sitting for long periods has the urge to move legs and also gets the same sensations in bed, like she has to move legs, as in restless leg syndrome. Not sleeping well, wakes every hour or two. Not snoring, no apneic episodes. Partner doesn't notice abnormal movements. No recent depression or stress. Life has been less stressful since son moved out of house and she feels good. No cramping. Balance is fine. No dizzyness. Brain feels "fuzzy" at times.  No recent illness or preceding illness. Some cognitive "fuzziness" for a few months. No headaches. Today her lips are numb symmetrically around the lips.  She currently has the paresthesias, started 8 days ago. Previous to this this she had gone 21 days without symptoms, had a small bout the beginning of august. Describes symptoms of pins and  needles up the whole arm up to shoulder on right, left arm in the fingers, legs to the knees, and around the lips.   CT of the head 03/10/2018 was normal (reviewed images)  Reviewed notes, labs and imaging from outside physicians, which showed recent CBC unremarkable, BMP with K 3.4. TSH 2.08.   Review of Systems: Patient complains of symptoms per HPI as well as the following symptoms: numbness and tingling . Pertinent negatives and positives per HPI. All others negative    Social History   Socioeconomic History  . Marital status: Married    Spouse name: [name]   . Number of children: 3  . Years of education: 12+  . Highest education level: Not on file  Occupational History  . Occupation: enrollment    Employer: [employer]  Tobacco Use  . Smoking status: Never Smoker  . Smokeless tobacco: Never Used  Vaping Use  . Vaping Use: Never used  Substance and Sexual Activity  . Alcohol use: No  . Drug use: No  . Sexual activity: Not on file  Other Topics Concern  . Not on file  Social History Narrative   Patient lives at home with husband [name].    Patient has 3 children.    Patient has a college education.    Patient is right handed.    Drinks 2-3 cups of caffeine daily   Social Determinants of Health   Financial Resource Strain:   . Difficulty of Paying Living Expenses: Not on file  Food Insecurity:   . Worried About Charity fundraiser in the Last Year: Not on file  . Ran Out of Food in the Last Year: Not on file  Transportation Needs:   . Lack of Transportation (Medical): Not on file  . Lack of Transportation (Non-Medical): Not on file  Physical Activity:   . Days of Exercise per Week: Not on file  . Minutes of Exercise per Session: Not on file  Stress:   . Feeling of Stress : Not on file  Social Connections:   . Frequency of Communication with Friends and Family: Not on file  . Frequency of Social Gatherings with Friends and Family: Not on  file  . Attends Religious Services: Not on file  . Active Member of Clubs or Organizations: Not on file  . Attends Archivist Meetings: Not on file  . Marital Status: Not on file  Intimate Partner Violence:   . Fear of Current or Ex-Partner: Not on file  . Emotionally Abused: Not on file  . Physically Abused: Not on file  . Sexually Abused: Not on file    Family History  Problem Relation Age of Onset  . Heart disease Mother   . Hypertension Mother   . Prostate cancer Father   . Other Father        MDS  . Hypertension Brother   . Hypertension Son     Past Medical History:  Diagnosis Date  . B12 deficiency   . Basal cell carcinoma   . Hypercholesteremia     Past Surgical History:  Procedure Laterality Date  . APPENDECTOMY  1992  . Excision of BCC R arm      Current Outpatient Medications  Medication Sig Dispense Refill  . atorvastatin (LIPITOR) 10 MG tablet TAKE 1 TABLET (10 MG TOTAL) BY MOUTH DAILY AT 6 PM. 90 tablet 1  . Calcium Citrate-Vitamin D (CALCIUM + D PO) Take by mouth daily.    . cyanocobalamin 1000 MCG tablet Take 1,000 mcg by mouth daily.     . pregabalin (LYRICA) 50 MG capsule TAKE 1 CAPSULE BY MOUTH EVERY DAY 30 capsule 2  . ALPRAZolam (XANAX) 0.25 MG tablet Take 1-2 tabs (0.25mg -0.50mg ) 30-60 minutes before procedure. May repeat if needed.Do not drive. 4 tablet 0   No current facility-administered medications for this visit.    Allergies as of 07/15/2020  . (No Known Allergies)    Vitals: BP 118/82 (BP Location: Right Arm, Patient Position: Sitting)   Pulse 82   Ht 5\' 8"  (1.727 m)   Wt 143 lb (64.9 kg)   SpO2 99%   BMI 21.74 kg/m  Last Weight:  Wt Readings from Last 1 Encounters:  07/15/20 143 lb (64.9 kg)   Last Height:   Ht Readings from Last 1 Encounters:  07/15/20 5\' 8"  (1.727 m)    Physical exam: Exam: Gen: NAD, conversant, well nourised, well groomed                     CV: No peripheral edema, warm, nontender Eyes:  Conjunctivae clear without exudates or hemorrhage  Neuro: Detailed Neurologic Exam  Speech:    Speech is normal; fluent and spontaneous with normal comprehension.  Cognition:    The patient is oriented to person, place, and time;     recent and remote memory intact;     language fluent;     normal attention, concentration,     fund of knowledge Cranial Nerves:    The pupils are equal, round, and reactive to light.  Visual fields are full to finger confrontation. Extraocular movements are intact. Trigeminal sensation is intact and the muscles of mastication are normal. The face is symmetric. The palate elevates in the midline. Hearing intact. Voice is normal. Shoulder shrug is normal. The tongue has normal motion without fasciculations.   Coordination:    No ataxia or dysmetria.    Gait:  normal native gait  Motor Observation:    No asymmetry, no atrophy, and no involuntary movements noted. Tone:    Normal muscle tone.    Posture:    Posture is normal. normal erect    Strength:    Strength is V/V in the upper and lower limbs.      Sensation: intact to LT     Reflex Exam:  DTR's:    Deep tendon reflexes in the upper and lower extremities are brisk bilaterally.     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 She has improved on B12 supplementation however continues to have paresthesias in the arms and legs, need to rule out subacute combined degeneration, myelopathy, demyelinating or other etiology c-spine  Orders Placed This Encounter  Procedures  . MR CERVICAL SPINE W WO CONTRAST  . Vitamin B12  . Methylmalonic acid, serum  . Lipid panel  . Vitamin D, 25-hydroxy  . Comprehensive metabolic panel  . TSH  . CBC with Differential/Platelets   Meds ordered this encounter  Medications  . ALPRAZolam (XANAX) 0.25 MG tablet    Sig: Take 1-2  tabs (0.25mg -0.50mg ) 30-60 minutes before procedure.  May repeat if needed.Do not drive.    Dispense:  4 tablet    Refill:  0     Sarina Ill, MD  [facility] [address] [place], [place] [address]  Phone [phone] Fax [phone]  Phone [phone] Fax [phone]  I spent more than 30 minutes of face-to-face and non-face-to-face time with patient on the  1. 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 Disease of spinal cord (HCC)   4. Paresthesias   5. 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 Abnormal deep tendon reflex   8. Hyperlipidemia, unspecified hyperlipidemia type    diagnosis.  This included previsit chart review, lab review, study review, order entry, electronic health record documentation, patient education on the different diagnostic and therapeutic options, counseling and coordination of care, risks and benefits of management, compliance, or risk factor reduction

## 2020-07-16 ENCOUNTER — Telehealth: Payer: Self-pay | Admitting: *Deleted

## 2020-07-16 LAB — METHYLMALONIC ACID, SERUM

## 2020-07-16 NOTE — Telephone Encounter (Signed)
I called the pt and discussed the lab results in detail per Dr Jaynee Eagles. Pt aware the only remaining lab is [facility] and we will only call if there are concerns. Pt verbalized appreciation for the call.

## 2020-07-16 NOTE — Telephone Encounter (Signed)
-----   Message from Melvenia Beam, MD sent at 07/16/2020  9:33 AM EDT ----- Labs look great. Ldl is 113 would like it less than 100 but 113 is great! B12 looks great as does Vitamin D.

## 2020-07-16 NOTE — Telephone Encounter (Signed)
Noted, thank you

## 2020-07-19 LAB — CBC WITH DIFFERENTIAL/PLATELET
Basophils Absolute: 0.1 10*3/uL (ref 0.0–0.2)
Basos: 1 %
EOS (ABSOLUTE): 0.1 10*3/uL (ref 0.0–0.4)
Eos: 2 %
Hematocrit: 39 % (ref 34.0–46.6)
Hemoglobin: 13.3 g/dL (ref 11.1–15.9)
Immature Grans (Abs): 0 10*3/uL (ref 0.0–0.1)
Immature Granulocytes: 0 %
Lymphocytes Absolute: 2.4 10*3/uL (ref 0.7–3.1)
Lymphs: 39 %
MCH: 30.3 pg (ref 26.6–33.0)
MCHC: 34.1 g/dL (ref 31.5–35.7)
MCV: 89 fL (ref 79–97)
Monocytes Absolute: 0.5 10*3/uL (ref 0.1–0.9)
Monocytes: 7 %
Neutrophils Absolute: 3.2 10*3/uL (ref 1.4–7.0)
Neutrophils: 51 %
Platelets: 229 10*3/uL (ref 150–450)
RBC: 4.39 x10E6/uL (ref 3.77–5.28)
RDW: 12.5 % (ref 11.7–15.4)
WBC: 6.3 10*3/uL (ref 3.4–10.8)

## 2020-07-19 LAB — COMPREHENSIVE METABOLIC PANEL
ALT: 24 IU/L (ref 0–32)
AST: 19 IU/L (ref 0–40)
Albumin/Globulin Ratio: 2.4 — ABNORMAL HIGH (ref 1.2–2.2)
Albumin: 4.7 g/dL (ref 3.8–4.9)
Alkaline Phosphatase: 73 IU/L (ref 44–121)
BUN/Creatinine Ratio: 14 (ref 9–23)
BUN: 11 mg/dL (ref 6–24)
Bilirubin Total: 0.7 mg/dL (ref 0.0–1.2)
CO2: 25 mmol/L (ref 20–29)
Calcium: 10 mg/dL (ref 8.7–10.2)
Chloride: 102 mmol/L (ref 96–106)
Creatinine, Ser: 0.78 mg/dL (ref 0.57–1.00)
GFR calc Af Amer: 97 mL/min/{1.73_m2} (ref >59)
GFR calc non Af Amer: 84 mL/min/{1.73_m2} (ref >59)
Globulin, Total: 2 g/dL (ref 1.5–4.5)
Glucose: 75 mg/dL (ref 65–99)
Potassium: 4.5 mmol/L (ref 3.5–5.2)
Sodium: 140 mmol/L (ref 134–144)
Total Protein: 6.7 g/dL (ref 6.0–8.5)

## 2020-07-19 LAB — LIPID PANEL
Chol/HDL Ratio: 2.7 ratio (ref 0.0–4.4)
Cholesterol, Total: 202 mg/dL — ABNORMAL HIGH (ref 100–199)
HDL: 74 mg/dL (ref >39)
LDL Chol Calc (NIH): 113 mg/dL — ABNORMAL HIGH (ref 0–99)
Triglycerides: 85 mg/dL (ref 0–149)
VLDL Cholesterol Cal: 15 mg/dL (ref 5–40)

## 2020-07-19 LAB — VITAMIN B12: Vitamin B-12: >2000 pg/mL — ABNORMAL HIGH (ref 232–1245)

## 2020-07-19 LAB — METHYLMALONIC ACID, SERUM

## 2020-07-19 LAB — TSH: TSH: 2.5 u[IU]/mL (ref 0.450–4.500)

## 2020-07-19 LAB — VITAMIN D 25 HYDROXY (VIT D DEFICIENCY, FRACTURES): Vit D, 25-Hydroxy: 41.9 ng/mL (ref 30.0–100.0)

## 2020-09-06 ENCOUNTER — Other Ambulatory Visit: Payer: Self-pay | Admitting: Neurology

## 2020-09-09 ENCOUNTER — Ambulatory Visit
Admission: EM | Admit: 2020-09-09 | Discharge: 2020-09-09 | Disposition: A | Discharge status: Home / Self Care | Payer: Commercial Managed Care - PPO | Attending: Emergency Medicine | Admitting: Emergency Medicine

## 2020-09-09 ENCOUNTER — Other Ambulatory Visit: Payer: Self-pay

## 2020-09-09 DIAGNOSIS — H00015 Hordeolum externum left lower eyelid: Secondary | ICD-10-CM

## 2020-09-09 MED ORDER — CEPHALEXIN 500 MG PO CAPS: 500.0000 mg | ORAL_CAPSULE | Freq: Four times a day (QID) | ORAL | 0 refills | Status: AC

## 2020-09-09 MED ORDER — POLYMYXIN B-TRIMETHOPRIM 10000-0.1 UNIT/ML-% OP SOLN
1.0000 [drp] | Freq: Four times a day (QID) | OPHTHALMIC | 0 refills | Status: DC
Start: 2020-09-09 — End: 2021-12-19

## 2020-09-09 NOTE — ED Triage Notes (Signed)
Pt states she has had redness and pain in her eye beginning Monday and worsening to today. Pt left eye only is affected. Pt complains of no decreased vision. Pt is aox4 and ambualtory.

## 2020-09-09 NOTE — Discharge Instructions (Signed)
Stye  -Warm compresses multiple times a day with massage afterward  - Do not wear contacts or make up  - Keep lid clean, may use baby shampoo diluted - Keflex and polytrim drops - Return if increasing, redness, pain or swelling, decreased vision.

## 2020-09-09 NOTE — ED Provider Notes (Signed)
[name]    CSN: [id number] Arrival date & time: 09/09/20  1233      History   Chief Complaint Chief Complaint  Patient presents with  . Eye Pain    Since monday    HPI Sonya Hunt is a 59 y.o. female presenting today for evaluation of eye redness and pain.  Reports over the past 3 days has developed increased redness pain and swelling to her left lower lid.  Has had some drainage in the past 24 hours into eye.  Denies vision changes.  Denies contact use.  Denies history of similar.  Denies difficulty moving eye  HPI  Past Medical History:  Diagnosis Date  . B12 deficiency   . Basal cell carcinoma   . Hypercholesteremia     Patient Active Problem List   Diagnosis Date Noted  . B12 deficiency 09/14/2015  . Neuropathy 09/14/2015  . Paresthesias 06/09/2014  . Tachycardia 05/15/2014    Past Surgical History:  Procedure Laterality Date  . APPENDECTOMY  1992  . Excision of BCC R arm      OB History   No obstetric history on file.      Home Medications    Prior to Admission medications   Medication Sig Start Date End Date Taking? Authorizing Provider  atorvastatin (LIPITOR) 10 MG tablet TAKE 1 TABLET (10 MG TOTAL) BY MOUTH DAILY AT 6 PM. 06/04/20  Yes Melvenia Beam, MD  Calcium Citrate-Vitamin D (CALCIUM + D PO) Take by mouth daily.   Yes [provider]  pregabalin (LYRICA) 50 MG capsule TAKE 1 CAPSULE BY MOUTH EVERY DAY 09/06/20  Yes Melvenia Beam, MD  cephALEXin (KEFLEX) 500 MG capsule Take 1 capsule (500 mg total) by mouth 4 (four) times daily for 5 days. 09/09/20 09/14/20  Lili Harts C, PA-C  trimethoprim-polymyxin b (POLYTRIM) ophthalmic solution Place 1-2 drops into the left eye every 6 (six) hours. 09/09/20   Mayco Walrond, Elesa Hacker, PA-C    Family History Family History  Problem Relation Age of Onset  . Heart disease Mother   . Hypertension Mother   . Prostate cancer Father   . Other Father        MDS  .  Hypertension Brother   . Hypertension Son     Social History Social History   Tobacco Use  . Smoking status: Never Smoker  . Smokeless tobacco: Never Used  Vaping Use  . Vaping Use: Never used  Substance Use Topics  . Alcohol use: No  . Drug use: No     Allergies   Patient has no known allergies.   Review of Systems Review of Systems  Constitutional: Negative for activity change, appetite change, chills, fatigue and fever.  HENT: Negative for congestion, ear pain, rhinorrhea, sinus pressure, sore throat and trouble swallowing.   Eyes: Positive for pain and redness. Negative for discharge.  Respiratory: Negative for cough, chest tightness and shortness of breath.   Cardiovascular: Negative for chest pain.  Gastrointestinal: Negative for abdominal pain, diarrhea, nausea and vomiting.  Musculoskeletal: Negative for myalgias.  Skin: Negative for rash.  Neurological: Negative for dizziness, light-headedness and headaches.     Physical Exam Triage Vital Signs ED Triage Vitals  Enc Vitals Group     BP 09/09/20 1306 (!) 151/91     Pulse Rate 09/09/20 1306 77     Resp 09/09/20 1306 18     Temp 09/09/20 1306 98.1 F (36.7 C)     Temp  Source 09/09/20 1306 Oral     SpO2 09/09/20 1306 99 %     Weight --      Height --      Head Circumference --      Peak Flow --      Pain Score 09/09/20 1308 5     Pain Loc --      Pain Edu? --      Excl. in [place]? --    No data found.  Updated Vital Signs BP (!) 151/91 (BP Location: Left Arm)   Pulse 77   Temp 98.1 F (36.7 C) (Oral)   Resp 18   SpO2 99%   Visual Acuity Right Eye Distance:   Left Eye Distance:   Bilateral Distance:    Right Eye Near:   Left Eye Near:    Bilateral Near:     Physical Exam Vitals and nursing note reviewed.  Constitutional:      Appearance: She is well-developed and well-nourished.     Comments: No acute distress  HENT:     Head: Normocephalic and atraumatic.     Nose: Nose normal.   Eyes:     Extraocular Movements: Extraocular movements intact.     Conjunctiva/sclera: Conjunctivae normal.     Pupils: Pupils are equal, round, and reactive to light.     Comments: Left lower lid with erythema and swelling, inner conjunctiva with increased erythema and central yellow appearing center draining purulent drainage  Erythema and induration seems localized to around lid line, has more mild and faint erythema noted to infraorbital area  Cardiovascular:     Rate and Rhythm: Normal rate.  Pulmonary:     Effort: Pulmonary effort is normal. No respiratory distress.  Abdominal:     General: There is no distension.  Musculoskeletal:        General: Normal range of motion.     Cervical back: Neck supple.  Skin:    General: Skin is warm and dry.  Neurological:     Mental Status: She is alert and oriented to person, place, and time.  Psychiatric:        Mood and Affect: Mood and affect normal.      UC Treatments / Results  Labs (all labs ordered are listed, but only abnormal results are displayed) Labs Reviewed - No data to display  EKG   Radiology No results found.  Procedures Procedures (including critical care time)  Medications Ordered in UC Medications - No data to display  Initial Impression / Assessment and Plan / UC Course  I have reviewed the triage vital signs and the nursing notes.  Pertinent labs & imaging results that were available during my care of the patient were reviewed by me and considered in my medical decision making (see chart for details).     Appears to have a hordeolum-treating with Keflex orally along with Polytrim topically, warm compresses and anti-inflammatories.  Monitor for gradual resolution.  Pain and swelling seems localized around lid, low suspicion of preseptal/septal cellulitis at this time.  Discussed strict return precautions. Patient verbalized understanding and is agreeable with plan.  Final Clinical Impressions(s) /  UC Diagnoses   Final diagnoses:  Hordeolum externum of left lower eyelid     Discharge Instructions     Stye  -Warm compresses multiple times a day with massage afterward  - Do not wear contacts or make up  - Keep lid clean, may use baby shampoo diluted - Keflex and polytrim drops -  Return if increasing, redness, pain or swelling, decreased vision.      ED Prescriptions    Medication Sig Dispense Auth. Provider   trimethoprim-polymyxin b (POLYTRIM) ophthalmic solution Place 1-2 drops into the left eye every 6 (six) hours. 10 mL Osaze Hubbert C, PA-C   cephALEXin (KEFLEX) 500 MG capsule Take 1 capsule (500 mg total) by mouth 4 (four) times daily for 5 days. 20 capsule Namya Voges, [place] C, PA-C     PDMP not reviewed this encounter.   Ailed Defibaugh, [place] C, PA-C 09/09/20 1420

## 2020-11-25 ENCOUNTER — Other Ambulatory Visit: Payer: Self-pay | Admitting: Neurology

## 2021-01-01 ENCOUNTER — Other Ambulatory Visit: Payer: Self-pay | Admitting: Neurology

## 2021-01-06 NOTE — Telephone Encounter (Signed)
Per [place] registry, last filled on 12/01/2020  Pregabalin 50 Mg Capsule #30/30. Next appt 07/21/21. Will send refill to Dr Jaynee Eagles.

## 2021-05-16 IMAGING — DX DG FOOT COMPLETE 3+V*L*
3 series · 3 of 3 positions shown · non-contrast
Comparison: None.

CLINICAL DATA: Small toe pain following injury last night.

EXAM:
LEFT FOOT - COMPLETE 3+ VIEW

[foot supine dp]
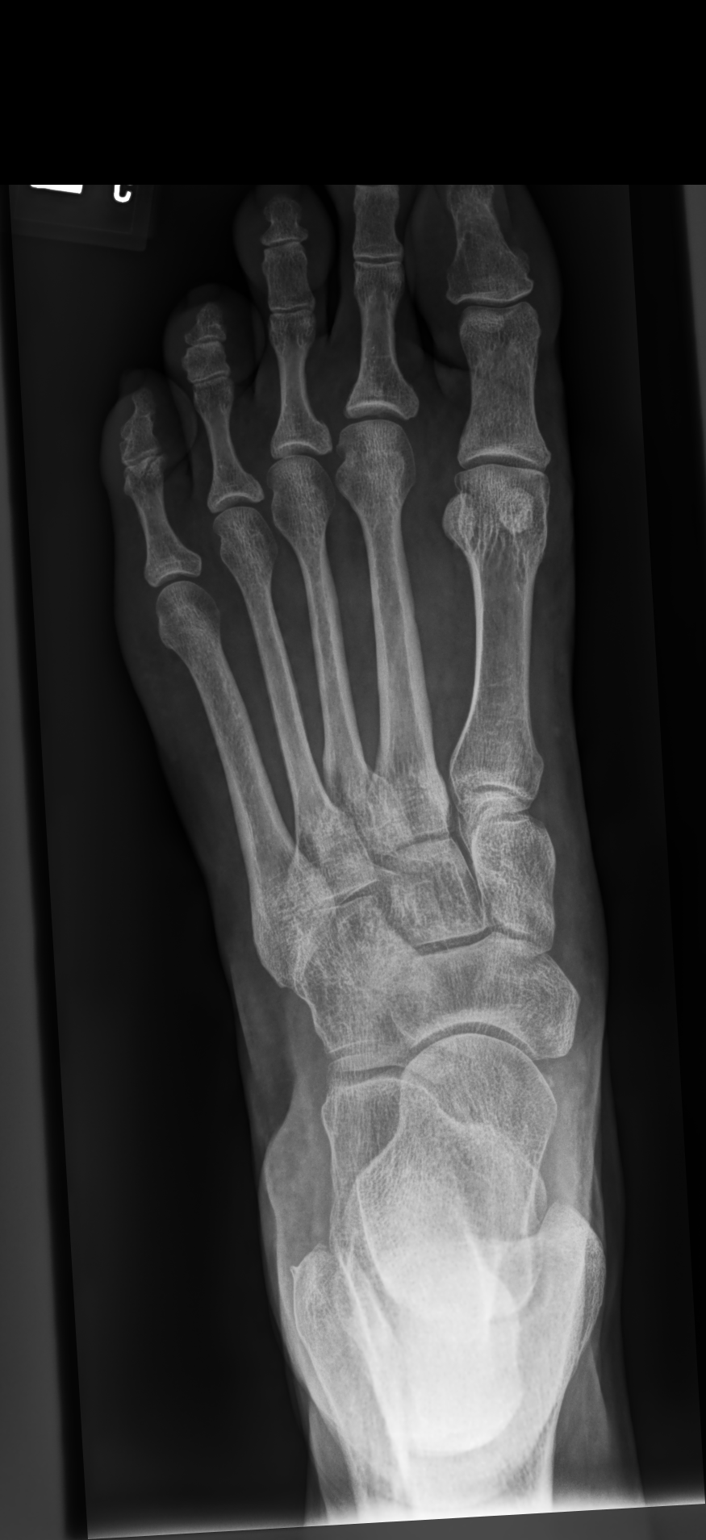

[foot medial oblique]
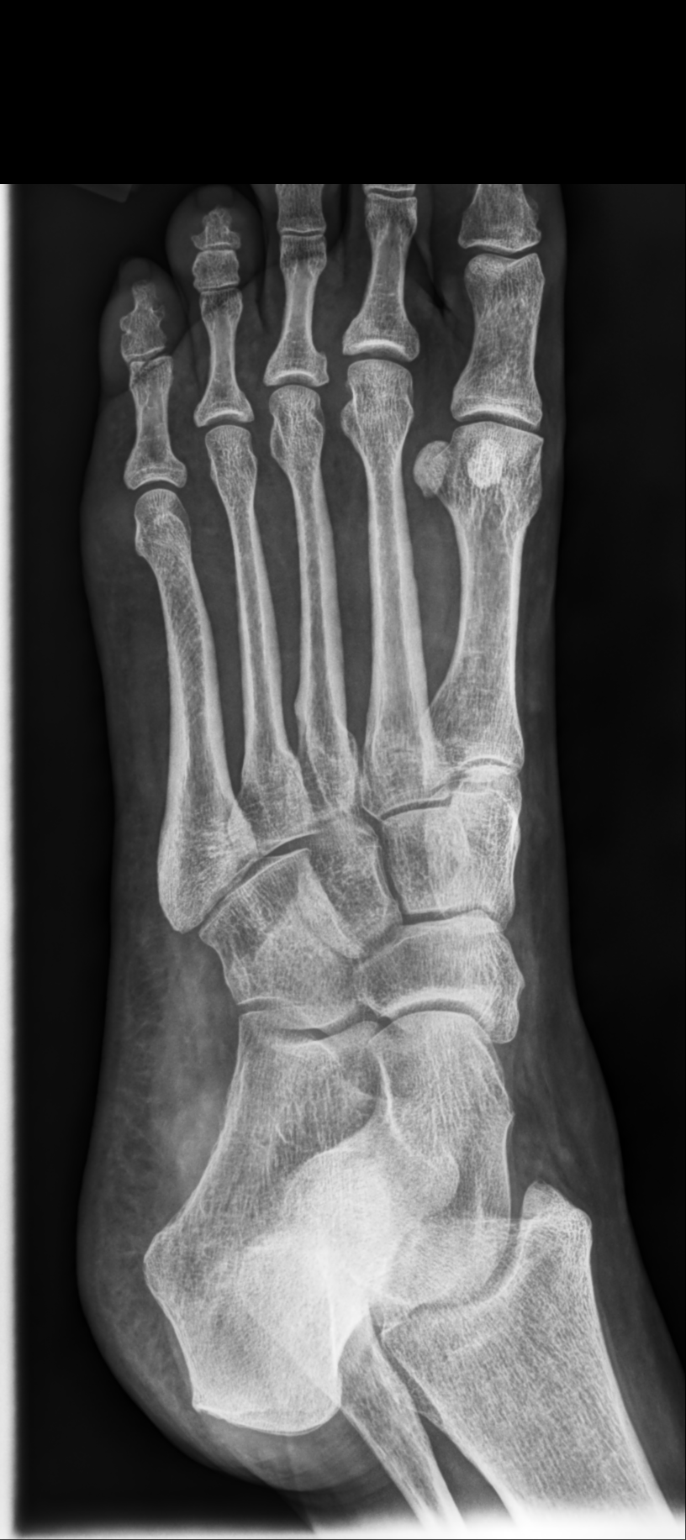

[foot supine lat]
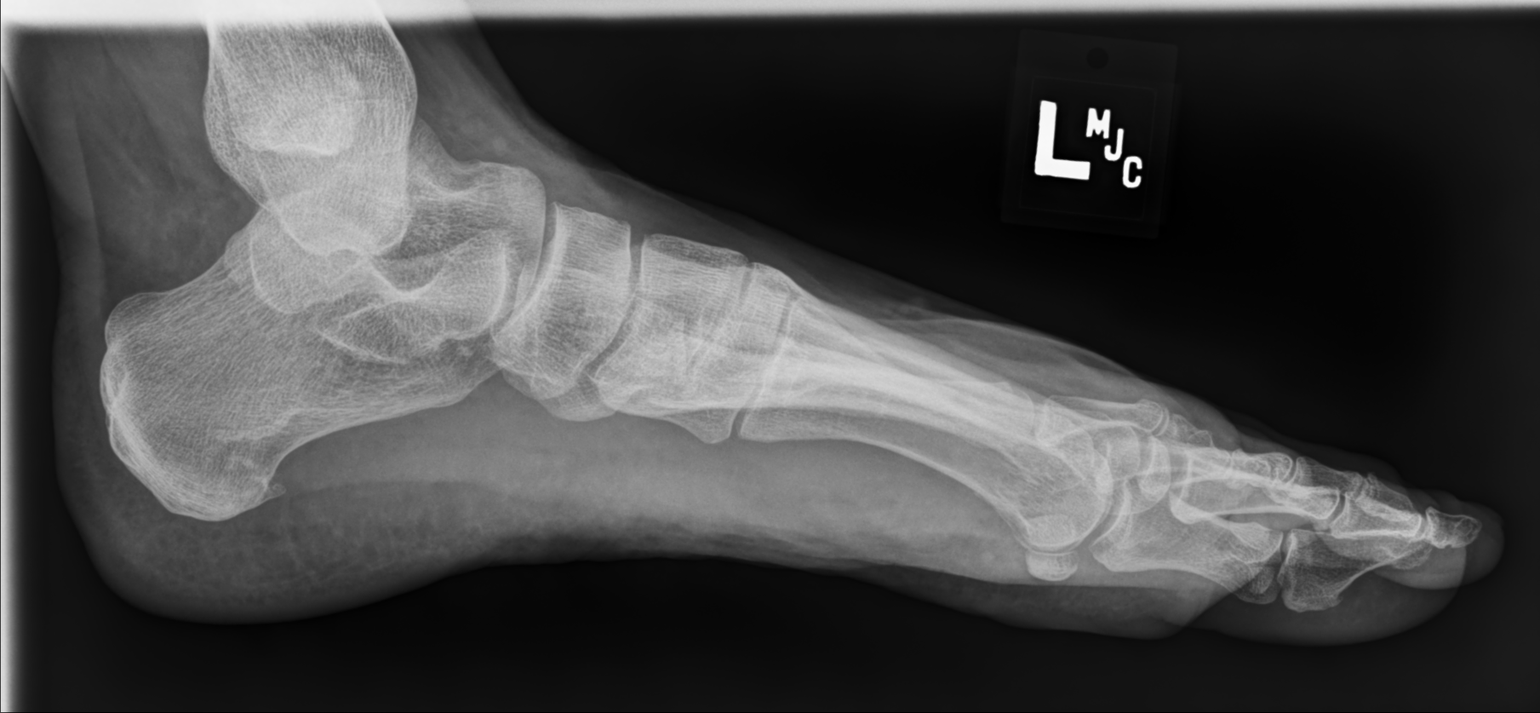

[3 of 3 positions shown; findings below may reference images not displayed]

FINDINGS: There is suspicion of a nondisplaced fracture involving the head of
the 5th proximal phalanx, although evaluation is limited by
overlying artifact. There is no displaced fracture or dislocation.
Mild joint space narrowing is present at the 1st metatarsophalangeal
joint. No evidence of foreign body or soft tissue emphysema.
IMPRESSION: Possible nondisplaced fracture of the 5th proximal phalangeal head.
Correlate with area of pain.

## 2021-05-26 ENCOUNTER — Other Ambulatory Visit: Payer: Self-pay | Admitting: Neurology

## 2021-07-06 ENCOUNTER — Other Ambulatory Visit: Payer: Self-pay | Admitting: Neurology

## 2021-07-14 ENCOUNTER — Ambulatory Visit (INDEPENDENT_AMBULATORY_CARE_PROVIDER_SITE_OTHER): Payer: Commercial Managed Care - PPO | Admitting: Neurology

## 2021-07-14 ENCOUNTER — Telehealth: Payer: Self-pay | Admitting: Neurology

## 2021-07-14 DIAGNOSIS — R202 Paresthesia of skin: Secondary | ICD-10-CM

## 2021-07-14 DIAGNOSIS — R200 Anesthesia of skin: Secondary | ICD-10-CM | POA: Diagnosis not present

## 2021-07-14 DIAGNOSIS — Z8639 Personal history of other endocrine, nutritional and metabolic disease: Secondary | ICD-10-CM

## 2021-07-14 DIAGNOSIS — M6281 Muscle weakness (generalized): Secondary | ICD-10-CM

## 2021-07-14 DIAGNOSIS — G1229 Other motor neuron disease: Secondary | ICD-10-CM

## 2021-07-14 DIAGNOSIS — R292 Abnormal reflex: Secondary | ICD-10-CM

## 2021-07-14 NOTE — Telephone Encounter (Signed)
Would one of you mind calling her to see if she would like to do her visit via video or phone? Or happy to see her in the office as well, up to her, thought I'd ask just for her convenience thanks

## 2021-07-14 NOTE — Telephone Encounter (Signed)
She agreed to a phone call visit

## 2021-07-14 NOTE — Progress Notes (Signed)
[facility]    Provider:  Dr Jaynee Eagles Referring Provider: No ref. provider found Primary Care Physician:  Patient, [name] (Inactive)   CC:  Paresthesias  Virtual Visit via Telephone Note  I connected with Bary Castilla on 07/18/21 at  2:00 PM EDT by telephone and verified that I am speaking with the correct person using two identifiers.  Location: Patient: home Provider: office   I discussed the limitations, risks, security and privacy concerns of performing an evaluation and management service by telephone and the availability of in person appointments. I also discussed with the patient that there may be a patient responsible charge related to this service. The patient expressed understanding and agreed to proceed.     Follow Up Instructions:    I discussed the assessment and treatment plan with the patient. The patient was provided an opportunity to ask questions and all were answered. The patient agreed with the plan and demonstrated an understanding of the instructions.   The patient was advised to call back or seek an in-person evaluation if the symptoms worsen or if the condition fails to improve as anticipated.  I provided 30 minutes of non-face-to-face time during this encounter.   Melvenia Beam, MD   07/14/2021: This is a lovely 60 year old female who I been following for several years for paresthesias.  Initially seen in September 2015 for numbness and tingling in all the limbs which started in the right arm, also numbness and tingling in the lips, started slowly and then progressed, significantly worsening, intense.  The right arm was always worse.  An extensive work-up showed B12 168.  CT of the head in 2019 was normal.  She started taking B12 and her symptoms appeared to be improving, over the years her B12 supplementation has improved her B12, we checked multiple other blood work including autoimmune disorders, heavy metals, hemoglobin A1c,  B1, folate, TSH, vitamin D, CCP, B1, B6, B12 and folate, RPR, Lyme, IFE and PE, ANA with reflex, Sjogren's, rheumatoid factor, hepatitis, Lyme, sed rate, HIV, angiotensin-converting enzyme.  MRI of the cervical spine was ordered but patient never completed it.  Today she reports she continues to have paresthesias mostly in her limbs, weakness moreso of the right arm, at this point we need to check an MRI of the brain and an MRI of the cervical spine to evaluate for strokes or demyelinating lesions or other etiology.   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 Doing much better. On supplementation. Extensive panel did not reveal any other etiology. She is stable, she still has numbness and tingling, it never goes away, her body always feels "strange". In all the limbs.  She has balance problems sometimes, she has numbness and tingling in the arms and legs, no neck pain.    Interval history: Initial B12 was 168.  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 Extensive panel did not reveal any other etiology.  Feeling stable. Lyrica helps with the paresthesias. Feels it impacts her mentation.  But if she doesn't take the lyrica it does feel ok.   Interval history: B12 was 168 2 years ago. Her paresthesias are improved on B12 supplementation. Discussed differential neuropathy. She still does have paresthesias but they are improved. We checked multiple labs and B12 deficiency was the only predisposing factors for neuropathy. However there are several more we can try including Sjogren's which also can cause pure sensory neuropathy. Her rheumatoid  factor was minimally elevated in the past so we can recheck with CCP antibodies. Also we can check vitamin B1 space, heavy metals, hemoglobin A1c and repeat B12 and folate.  Interval history: she started taking B12 and her symptoms appeared to be improved. She still has some tingling. She still ahs  paresthesias in the fingers and the toes. Discussed B12 deficiency in detail, she takes 1037mcg a day. She can increase it to 2071mcg daily. Will perform emg/ncs to assess for any other cause of paresthjesias.   HPI:  LENEE FRANZE is a 60 y.o. female here as a referral from Dr. Laurann Montana for Paresthesias. The week after mother's day she felt numbness, tingling in all the limbs. Started in the right arm, like her arm was asleep. Also numbness and tingly in the lips. Started slowly and progressed. Got significantly worse in May. Went away for a week with just some paresthesias at night. Is waxing and waning. At it's height in July around the 18th she had a week of the symptoms and it was getting very intense and her heart started beating really fast, they went to the emergency room and she was tachycardic. Paresthesias went up to 8/10 in discomfort. They go away for a few weeks and then come back. Symptoms always start slowly and ascend with a strange sensation of paresthesias and numbness, always starting in the right arm. No neck pain, no back pain, no weakness. She has warm sensations, maybe entering menopause, b which tends to happen at the same time as the paresthesias. No diabetes. She works out regularly, runs. Went ot the cardiologist and EKG was normal. Right arm always more intense. No other focal symptoms, denies weakness, vision changes, speech or swallow problems. She has an overactive bladder. Nothing seems to make symptoms worse or trigger them, better if walking around or moving. No SOB. Sometimes when sitting for long periods has the urge to move legs and also gets the same sensations in bed, like she has to move legs, as in restless leg syndrome. Not sleeping well, wakes every hour or two. Not snoring, no apneic episodes. Partner doesn't notice abnormal movements. No recent depression or stress. Life has been less stressful since son moved out of house and she feels good. No cramping. Balance is  fine. No dizzyness. Brain feels "fuzzy" at times. No recent illness or preceding illness. Some cognitive "fuzziness" for a few months. No headaches. Today her lips are numb symmetrically around the lips.   She currently has the paresthesias, started 8 days ago.  Previous to this this she had gone 21 days without symptoms, had a small bout the beginning of august. Describes symptoms of pins and needles up the whole arm up to shoulder on right, left arm in the fingers, legs to the knees, and around the lips.     CT of the head 03/10/2018 was normal (reviewed images)   Reviewed notes, labs and imaging from outside physicians, which showed recent CBC unremarkable, BMP with K 3.4. TSH 2.08.    Review of Systems: Patient complains of symptoms per HPI as well as the following symptoms: paresthesias . Pertinent negatives and positives per HPI. All others negative      Social History   Socioeconomic History   Marital status: Married    Spouse name: [name]    Number of children: 3   Years of education: 12+   Highest education level: Not on file  Occupational History   Occupation: enrollment  Employer: [employer]  Tobacco Use   Smoking status: Never   Smokeless tobacco: Never  Vaping Use   Vaping Use: Never used  Substance and Sexual Activity   Alcohol use: No   Drug use: No   Sexual activity: Not Currently  Other Topics Concern   Not on file  Social History Narrative   Patient lives at home with husband [name].    Patient has 3 children.    Patient has a college education.    Patient is right handed.    Drinks 2-3 cups of caffeine daily   Social Determinants of Health   Financial Resource Strain: Not on file  Food Insecurity: Not on file  Transportation Needs: Not on file  Physical Activity: Not on file  Stress: Not on file  Social Connections: Not on file  Intimate Partner Violence: Not on file    Family History  Problem Relation Age of Onset   Heart  disease Mother    Hypertension Mother    Prostate cancer Father    Other Father        MDS   Hypertension Brother    Hypertension Son     Past Medical History:  Diagnosis Date   B12 deficiency    Basal cell carcinoma    Hypercholesteremia     Past Surgical History:  Procedure Laterality Date   APPENDECTOMY  1992   Excision of BCC R arm      Current Outpatient Medications  Medication Sig Dispense Refill   atorvastatin (LIPITOR) 10 MG tablet TAKE 1 TABLET (10 MG TOTAL) BY MOUTH DAILY AT 6 PM. 30 tablet 3   Calcium Citrate-Vitamin D (CALCIUM + D PO) Take by mouth daily.     pregabalin (LYRICA) 50 MG capsule TAKE 1 CAPSULE BY MOUTH EVERY DAY 30 capsule 5   trimethoprim-polymyxin b (POLYTRIM) ophthalmic solution Place 1-2 drops into the left eye every 6 (six) hours. 10 mL 0   No current facility-administered medications for this visit.    Allergies as of 07/14/2021   (No Known Allergies)    Vitals: There were no vitals taken for this visit. Last Weight:  Wt Readings from Last 1 Encounters:  07/15/20 143 lb (64.9 kg)   Last Height:   Ht Readings from Last 1 Encounters:  07/15/20 5\' 8"  (1.727 m)   Telephone note:  Neuro: Detailed Neurologic Exam  Speech:    Speech is normal; fluent and spontaneous with normal comprehension.  Cognition:    The patient is oriented to person, place, and time;     recent and remote memory intact;     language fluent;     normal attention, concentration,     fund of knowledge     Prior Reflex Exam:  DTR's:    Deep tendon reflexes in the upper and lower extremities are brisk bilaterally.      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 She has improved on B12 supplementation however continues to have paresthesias in the arms and legs, weakness, need to rule out subacute combined degeneration, myelopathy, demyelinating disease such as MS or other etiology  and emg/ncs.  This is a lovely 59 year old female who I been following for several years for paresthesias.  Initially seen in September 2015 for numbness and tingling in all the limbs which started in the right arm, also numbness and tingling in the lips, started slowly and then  progressed, significantly worsening, intense.  The right arm was always worse.  An extensive work-up showed B12 168.  CT of the head in 2019 was normal.  She started taking B12 and her symptoms appeared to be improving, over the years her B12 supplementation has improved her B12, we checked multiple other blood work including autoimmune disorders, heavy metals, hemoglobin A1c, B1, folate, TSH, vitamin D, CCP, B1, B6, B12 and folate, RPR, Lyme, IFE and PE, ANA with reflex, Sjogren's, rheumatoid factor, hepatitis, Lyme, sed rate, HIV, angiotensin-converting enzyme.  MRI of the cervical spine was ordered but patient never completed it.  Today she reports she continues to have paresthesias mostly in her limbs, weakness moreso of the right arm, at this point we need to check an MRI of the brain and an MRI of the cervical spine to evaluate for strokes or demyelinating lesions or other etiology.  Orders Placed This Encounter  Procedures   MR BRAIN W WO CONTRAST   MR CERVICAL SPINE W WO CONTRAST   NCV with EMG(electromyography)    No orders of the defined types were placed in this encounter.     Sarina Ill, MD   [facility] [address] [place], [place] [address]   Phone [phone] Fax [phone]   Phone [phone] Fax [phone]   I spent more than 30 minutes of face-to-face and non-face-to-face time with patient on the  1. Paresthesias   2. Numbness and tingling   3. Muscle weakness of extremity   4. Hx of non anemic vitamin B12 deficiency   5. Numbness and tingling of right arm   6. Abnormal DTR (deep tendon reflex)   7. Screening due to symptoms and signs of Upper motor  neuron lesion (HCC)     diagnosis.  This included previsit chart review, lab review, study review, order entry, electronic health record documentation, patient education on the different diagnostic and therapeutic options, counseling and coordination of care, risks and benefits of management, compliance, or risk factor reduction

## 2021-07-21 ENCOUNTER — Ambulatory Visit: Payer: Commercial Managed Care - PPO | Admitting: Neurology

## 2021-07-22 ENCOUNTER — Other Ambulatory Visit: Payer: Self-pay | Admitting: Neurology

## 2021-07-22 ENCOUNTER — Telehealth: Payer: Self-pay | Admitting: Neurology

## 2021-07-22 MED ORDER — ALPRAZOLAM 0.25 MG PO TABS: ORAL_TABLET | ORAL | 0 refills | Status: DC

## 2021-07-22 NOTE — Telephone Encounter (Signed)
[facility] auth: NPR spoke to [facility] Ref # [id number].  I spoke with the patient and she informed me she would like to go some where that has an open mri machine. I told her that I will fax the order to [facility].  She also stated she would like something to help her since she is claustrophobic.

## 2021-07-26 NOTE — Telephone Encounter (Signed)
Faxed order to [facility], they will reach out to the patient to schedule.

## 2021-07-27 NOTE — Telephone Encounter (Signed)
schedule for 11/11 1PM at [facility]

## 2021-09-02 ENCOUNTER — Encounter: Payer: Commercial Managed Care - PPO | Admitting: Neurology

## 2021-09-07 ENCOUNTER — Other Ambulatory Visit: Payer: Self-pay | Admitting: Neurology

## 2021-10-21 ENCOUNTER — Encounter: Payer: Commercial Managed Care - PPO | Admitting: Neurology

## 2021-12-19 ENCOUNTER — Ambulatory Visit
Admission: EM | Admit: 2021-12-19 | Discharge: 2021-12-19 | Disposition: A | Discharge status: Home / Self Care | Payer: Commercial Managed Care - PPO

## 2021-12-19 ENCOUNTER — Encounter: Payer: Self-pay | Admitting: Emergency Medicine

## 2021-12-19 ENCOUNTER — Other Ambulatory Visit: Payer: Self-pay

## 2021-12-19 DIAGNOSIS — J4521 Mild intermittent asthma with (acute) exacerbation: Secondary | ICD-10-CM

## 2021-12-19 DIAGNOSIS — J302 Other seasonal allergic rhinitis: Secondary | ICD-10-CM

## 2021-12-19 DIAGNOSIS — R058 Other specified cough: Secondary | ICD-10-CM

## 2021-12-19 MED ORDER — CETIRIZINE HCL 10 MG PO TABS: 10.0000 mg | ORAL_TABLET | Freq: Every day | ORAL | 2 refills | Status: DC

## 2021-12-19 MED ORDER — ALBUTEROL SULFATE HFA 108 (90 BASE) MCG/ACT IN AERS: 2.0000 | INHALATION_SPRAY | Freq: Four times a day (QID) | RESPIRATORY_TRACT | 0 refills | Status: DC | PRN

## 2021-12-19 MED ORDER — AEROCHAMBER PLUS FLO-VU LARGE MISC: 1.0000 | Freq: Once | 0 refills | Status: AC

## 2021-12-19 MED ORDER — ALBUTEROL SULFATE (2.5 MG/3ML) 0.083% IN NEBU
2.5000 mg | INHALATION_SOLUTION | Freq: Once | RESPIRATORY_TRACT | Status: AC
  Administered 2021-12-19: 2.5 mg via RESPIRATORY_TRACT

## 2021-12-19 MED ORDER — FLUTICASONE PROPIONATE 50 MCG/ACT NA SUSP: 1.0000 | Freq: Every day | NASAL | 1 refills | Status: DC

## 2021-12-19 NOTE — ED Provider Notes (Signed)
?[place] ? ? ? ?CSN: [id number] ?Arrival date & time: 12/19/21  1308 ?  ? ?HISTORY  ? ?Chief Complaint  ?Patient presents with  ? Cough  ? ?HPI ?Sonya Hunt is a 61 y.o. female. Patient c/o cough x 7 days, congestion, nasal drainage x 9 days, denies any fever.  Patient has taken Advil with little relief, states she has been sucking on cough drops to prevent the cough.  Patient states at this point, the cough is her worst symptoms and the nasal congestion is only mildly bothersome.  Patient denies history of allergies and asthma.  Patient has significantly elevated blood pressure on arrival today.  Patient reports a history of whitecoat syndrome. ? ? ?Past Medical History:  ?Diagnosis Date  ? B12 deficiency   ? Basal cell carcinoma   ? Hypercholesteremia   ? ?Patient Active Problem List  ? Diagnosis Date Noted  ? B12 deficiency 09/14/2015  ? Neuropathy 09/14/2015  ? Paresthesias 06/09/2014  ? Tachycardia 05/15/2014  ? ?Past Surgical History:  ?Procedure Laterality Date  ? APPENDECTOMY  1992  ? Excision of BCC R arm    ? ?OB History   ?No obstetric history on file. ?  ? ?Home Medications   ? ?Prior to Admission medications   ?Medication Sig Start Date End Date Taking? Authorizing Provider  ?ALPRAZolam (XANAX) 0.25 MG tablet Take 1-2 tabs (0.'25mg'$ -0.'50mg'$ ) 30-60 minutes before procedure. May repeat if needed.Do not drive. 07/22/21   Melvenia Beam, MD  ?atorvastatin (LIPITOR) 10 MG tablet TAKE 1 TABLET (10 MG TOTAL) BY MOUTH DAILY AT 6 PM. 09/07/21   Melvenia Beam, MD  ?Calcium Citrate-Vitamin D (CALCIUM + D PO) Take by mouth daily.    [provider]  ?pregabalin (LYRICA) 50 MG capsule TAKE 1 CAPSULE BY MOUTH EVERY DAY 07/07/21   Melvenia Beam, MD  ?trimethoprim-polymyxin b (POLYTRIM) ophthalmic solution Place 1-2 drops into the left eye every 6 (six) hours. 09/09/20   Wieters, Elesa Hacker, PA-C  ? ?Family History ?Family History  ?Problem Relation Age of Onset  ? Heart disease Mother    ? Hypertension Mother   ? Prostate cancer Father   ? Other Father   ?     MDS  ? Hypertension Brother   ? Hypertension Son   ? ?Social History ?Social History  ? ?Tobacco Use  ? Smoking status: Never  ? Smokeless tobacco: Never  ?Vaping Use  ? Vaping Use: Never used  ?Substance Use Topics  ? Alcohol use: No  ? Drug use: No  ? ?Allergies   ?Patient has no known allergies. ? ?Review of Systems ?Review of Systems ?Pertinent findings noted in history of present illness.  ? ?Physical Exam ?Triage Vital Signs ?ED Triage Vitals  ?Enc Vitals Group  ?   BP 07/23/21 0827 (!) 147/82  ?   Pulse Rate 07/23/21 0827 72  ?   Resp 07/23/21 0827 18  ?   Temp 07/23/21 0827 98.3 ?F (36.8 ?C)  ?   Temp Source 07/23/21 0827 Oral  ?   SpO2 07/23/21 0827 98 %  ?   Weight --   ?   Height --   ?   Head Circumference --   ?   Peak Flow --   ?   Pain Score 07/23/21 0826 5  ?   Pain Loc --   ?   Pain Edu? --   ?   Excl. in [place]? --   ?No data  found. ? ?Updated Vital Signs ?BP (!) 175/85 (BP Location: Left Arm)   Pulse 88   Temp 98.2 ?F (36.8 ?C) (Oral)   Resp 18   Ht '5\' 8"'$  (1.727 m)   Wt 142 lb (64.4 kg)   SpO2 97%   BMI 21.59 kg/m?  ? ?Physical Exam ?Vitals and nursing note reviewed.  ?Constitutional:   ?   General: She is not in acute distress. ?   Appearance: Normal appearance. She is not ill-appearing.  ?HENT:  ?   Head: Normocephalic and atraumatic.  ?   Salivary Glands: Right salivary gland is not diffusely enlarged or tender. Left salivary gland is not diffusely enlarged or tender.  ?   Right Ear: Hearing, ear canal and external ear normal. No drainage. A middle ear effusion is present. There is no impacted cerumen. Tympanic membrane is bulging. Tympanic membrane is not erythematous.  ?   Left Ear: Hearing, ear canal and external ear normal. No drainage. A middle ear effusion is present. There is no impacted cerumen. Tympanic membrane is bulging. Tympanic membrane is not erythematous.  ?   Ears:  ?   Comments: Bilateral TMs bulging  with clear fluid ?   Nose: Mucosal edema, congestion and rhinorrhea present. No nasal deformity or septal deviation. Rhinorrhea is clear.  ?   Right Turbinates: Not enlarged, swollen or pale.  ?   Left Turbinates: Not enlarged, swollen or pale.  ?   Right Sinus: No maxillary sinus tenderness or frontal sinus tenderness.  ?   Left Sinus: No maxillary sinus tenderness or frontal sinus tenderness.  ?   Mouth/Throat:  ?   Lips: Pink. No lesions.  ?   Mouth: Mucous membranes are moist. No oral lesions.  ?   Pharynx: Oropharynx is clear. Uvula midline. No pharyngeal swelling, oropharyngeal exudate, posterior oropharyngeal erythema or uvula swelling.  ?   Tonsils: No tonsillar exudate or tonsillar abscesses. 0 on the right. 0 on the left.  ?Eyes:  ?   General: Lids are normal.     ?   Right eye: No discharge.     ?   Left eye: No discharge.  ?   Extraocular Movements: Extraocular movements intact.  ?   Conjunctiva/sclera: Conjunctivae normal.  ?   Right eye: Right conjunctiva is not injected.  ?   Left eye: Left conjunctiva is not injected.  ?Neck:  ?   Trachea: Trachea and phonation normal.  ?Cardiovascular:  ?   Rate and Rhythm: Normal rate and regular rhythm.  ?   Pulses: Normal pulses.  ?   Heart sounds: Normal heart sounds. No murmur heard. ?  No friction rub. No gallop.  ?Pulmonary:  ?   Effort: Pulmonary effort is normal. No tachypnea, bradypnea, accessory muscle usage, prolonged expiration, respiratory distress or retractions.  ?   Breath sounds: No stridor, decreased air movement or transmitted upper airway sounds. Examination of the right-lower field reveals decreased breath sounds. Decreased breath sounds present. No wheezing, rhonchi or rales.  ?   Comments: Mildly decreased breath sounds on the right. ? ?Repeat auscultation post nebulized albuterol treatment revealed significant improvement of breath sounds in right lower lung fields. ?Chest:  ?   Chest wall: No tenderness.  ?Musculoskeletal:     ?   General:  Normal range of motion.  ?   Cervical back: Full passive range of motion without pain, normal range of motion and neck supple. Normal range of motion.  ?Lymphadenopathy:  ?  Cervical: No cervical adenopathy.  ?   Right cervical: No superficial, deep or posterior cervical adenopathy. ?   Left cervical: No superficial, deep or posterior cervical adenopathy.  ?Skin: ?   General: Skin is warm and dry.  ?   Findings: No erythema or rash.  ?Neurological:  ?   General: No focal deficit present.  ?   Mental Status: She is alert and oriented to person, place, and time.  ?Psychiatric:     ?   Mood and Affect: Mood normal.     ?   Behavior: Behavior normal.  ? ? ?Visual Acuity ?Right Eye Distance:   ?Left Eye Distance:   ?Bilateral Distance:   ? ?Right Eye Near:   ?Left Eye Near:    ?Bilateral Near:    ? ?UC Couse / Diagnostics / Procedures:  ?  ?EKG ? ?Radiology ?No results found. ? ?Procedures ?Procedures (including critical care time) ? ?UC Diagnoses / Final Clinical Impressions(s)   ?I have reviewed the triage vital signs and the nursing notes. ? ?Pertinent labs & imaging results that were available during my care of the patient were reviewed by me and considered in my medical decision making (see chart for details).   ?Final diagnoses:  ?Seasonal allergies  ?Mild intermittent reactive airway disease with acute exacerbation  ?Nonproductive cough  ? ?Patient had a good response to albuterol neb treatment.  Recommend that she continue albuterol and add Flonase and Zyrtec for acute allergy relief.  Patient advised that she can continue Advil if she finds this beneficial. ? ?ED Prescriptions   ?None ?  ? ?PDMP not reviewed this encounter. ? ?Pending results:  ?Labs Reviewed - No data to display ? ?Medications Ordered in UC: ?Medications  ?albuterol (PROVENTIL) (2.5 MG/3ML) 0.083% nebulizer solution 2.5 mg (2.5 mg Nebulization Given 12/19/21 1457)  ? ? ?Disposition Upon Discharge:  ?Condition: stable for discharge home ?Home:  take medications as prescribed; routine discharge instructions as discussed; follow up as advised. ? ?Patient presented with an acute illness with associated systemic symptoms and significant discomfort req

## 2021-12-19 NOTE — ED Triage Notes (Signed)
Patient c/o cough x 7 days, congestion, nasal drainage x 9 days, denies any fever.  Patient has taken Advil. ?

## 2021-12-19 NOTE — Discharge Instructions (Addendum)
Your symptoms and my physical exam findings are concerning for exacerbation of your underlying allergies.  It is important that you are consistent with taking allergy medications exactly as prescribed.  I do not believe that you are suffering from pneumonia or bronchitis at this time.  I also do not believe that your symptoms are infectious. ?  ?Please see the list below for recommended medications, dosages and frequencies to provide relief of your current symptoms:   ?  ?ProAir, Ventolin, Proventil (albuterol): This inhaled medication contains a short acting beta agonist bronchodilator.  This medication works on the smooth muscle that opens and constricts of your airways by relaxing the muscle.  The result of relaxation of the smooth muscle is increased air movement and improved work of breathing.  This is a short acting medication that can be used every 4-6 hours as needed for increased work of breathing, shortness of breath, wheezing and excessive coughing.  I have provided you with a prescription.  ?  ?Zyrtec (cetirizine): This is an excellent second-generation antihistamine that helps to reduce respiratory inflammatory response to environmental allergens.  In some patients, this medication can cause daytime sleepiness so I recommend that you take 1 tablet daily at bedtime.   ?  ?Flonase (fluticasone): This is a steroid nasal spray that you use once daily, 1 spray in each nare.  This medication does not work well if you decide to use it only used as you feel you need to, it works best used on a daily basis.  After 3 to 5 days of use, you will notice significant reduction of the inflammation and mucus production that is currently being caused by exposure to allergens, whether seasonal or environmental.  The most common side effect of this medication is nosebleeds.  If you experience a nosebleed, please discontinue use for 1 week, then feel free to resume.  I have provided you with a prescription but you can also  purchase this medication over-the-counter if your insurance will not cover it. ? ?Ibuprofen  (Advil, Motrin): This is a good anti-inflammatory medication which addresses aches, pains and inflammation of the upper airways that causes sinus and nasal congestion as well as in the lower airways which makes your cough feel tight and sometimes burn.  I recommend that you take between 400 to 600 mg every 6-8 hours as needed.    ? ?If you find that you have not had significant relief of your symptoms in the next 3 to 5 days, please follow-up with your primary care provider or return to urgent care for repeat evaluation. ?  ?Thank you for visiting urgent care today.  We appreciate the opportunity to participate in your care. ?  ? ?

## 2022-01-17 ENCOUNTER — Other Ambulatory Visit: Payer: Self-pay | Admitting: Neurology

## 2022-05-13 ENCOUNTER — Other Ambulatory Visit: Payer: Self-pay | Admitting: Neurology

## 2022-05-16 ENCOUNTER — Other Ambulatory Visit: Payer: Self-pay | Admitting: Neurology

## 2022-05-16 MED ORDER — PREGABALIN 50 MG PO CAPS: 50.0000 mg | ORAL_CAPSULE | Freq: Every day | ORAL | 1 refills | Status: DC

## 2022-09-11 ENCOUNTER — Other Ambulatory Visit: Payer: Self-pay | Admitting: Neurology

## 2022-09-21 ENCOUNTER — Other Ambulatory Visit: Payer: Self-pay

## 2022-09-21 ENCOUNTER — Telehealth: Payer: Self-pay | Admitting: Neurology

## 2022-09-21 MED ORDER — ATORVASTATIN CALCIUM 10 MG PO TABS: 10.0000 mg | ORAL_TABLET | Freq: Every day | ORAL | 0 refills | Status: DC

## 2022-09-21 NOTE — Telephone Encounter (Signed)
Atorvastatin sent for 30day supply, pharmacy has a refill on file to be filled for Lyrica. Pt must complete FU for further refills.

## 2022-09-21 NOTE — Telephone Encounter (Signed)
Pt called needing a refill request on her atorvastatin (LIPITOR) 10 MG tablet and her pregabalin (LYRICA) 50 MG capsule sent to the [facility] on [address].  Pt has scheduled her yearly f/u

## 2022-10-17 ENCOUNTER — Other Ambulatory Visit: Payer: Self-pay | Admitting: Neurology

## 2022-10-26 ENCOUNTER — Ambulatory Visit: Payer: BC Managed Care – PPO | Admitting: Neurology

## 2022-10-26 ENCOUNTER — Encounter: Payer: Self-pay | Admitting: Neurology

## 2022-10-26 VITALS — BP 117/68 | HR 80 | Ht 68.0 in | Wt 139.6 lb

## 2022-10-26 DIAGNOSIS — H9193 Unspecified hearing loss, bilateral: Secondary | ICD-10-CM | POA: Diagnosis not present

## 2022-10-26 DIAGNOSIS — R5383 Other fatigue: Secondary | ICD-10-CM

## 2022-10-26 DIAGNOSIS — H9313 Tinnitus, bilateral: Secondary | ICD-10-CM | POA: Diagnosis not present

## 2022-10-26 DIAGNOSIS — E559 Vitamin D deficiency, unspecified: Secondary | ICD-10-CM | POA: Diagnosis not present

## 2022-10-26 DIAGNOSIS — E538 Deficiency of other specified B group vitamins: Secondary | ICD-10-CM

## 2022-10-26 DIAGNOSIS — H9190 Unspecified hearing loss, unspecified ear: Secondary | ICD-10-CM

## 2022-10-26 DIAGNOSIS — H9319 Tinnitus, unspecified ear: Secondary | ICD-10-CM

## 2022-10-26 MED ORDER — PREGABALIN 50 MG PO CAPS: 50.0000 mg | ORAL_CAPSULE | Freq: Every day | ORAL | 5 refills | Status: DC

## 2022-10-26 MED ORDER — ATORVASTATIN CALCIUM 10 MG PO TABS: 10.0000 mg | ORAL_TABLET | Freq: Every day | ORAL | 4 refills | Status: DC

## 2022-10-26 NOTE — Progress Notes (Signed)
[facility]    Provider:  Dr Sonya Hunt Referring Provider: No ref. provider found Primary Care Physician:  Patient, [name]   CC:  Paresthesias  10/26/2022: She says she never went through the MRI of the brain or cervical spine(ordered twice, twice did not have it completed). She doesn't have a pcp. Encouraged her to get one, she needs a colonoscopy and mammogram and routine checkups and dexa scan, pap smears etc and other things she can be evaluated for primary care. She still has paresthesias, she just "chickened out" of the MRIs I ordered a year ago. She had an emg/ncs which was normal in the past as well as extensive serum workup. Stable. She has ringing in the ear and requests hearing testing or ENT, has [facility], will send again, if he is not there he can see any provider in that group as she is established there. No other focal neurologic deficits, associated symptoms, inciting events or modifiable factors.  Patient complains of symptoms per HPI as well as the following symptoms: anxiety . Pertinent negatives and positives per HPI. All others negative   07/14/2021: This is a lovely 62 year old female who I been following for several years for paresthesias.  Initially seen in September 2015 for numbness and tingling in all the limbs which started in the right arm, also numbness and tingling in the lips, started slowly and then progressed, significantly worsening, intense.  The right arm was always worse.  An extensive work-up showed B12 168.  CT of the head in 2019 was normal.  She started taking B12 and her symptoms appeared to be improving, over the years her B12 supplementation has improved her B12, we checked multiple other blood work including autoimmune disorders, heavy metals, hemoglobin A1c, B1, folate, TSH, vitamin D, CCP, B1, B6, B12 and folate, RPR, Lyme, IFE and PE, ANA with reflex, Sjogren's, rheumatoid factor, hepatitis, Lyme, sed rate,  HIV, angiotensin-converting enzyme.  MRI of the cervical spine was ordered but patient never completed it.  Today she reports she continues to have paresthesias mostly in her limbs, weakness moreso of the right arm, at this point we need to check an MRI of the brain and an MRI of the cervical spine to evaluate for strokes or demyelinating lesions or other etiology.   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 Doing much better. On supplementation. Extensive panel did not reveal any other etiology. She is stable, she still has numbness and tingling, it never goes away, her body always feels "strange". In all the limbs.  She has balance problems sometimes, she has numbness and tingling in the arms and legs, no neck pain.    Interval history: Initial B12 was 168.  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 Extensive panel did not reveal any other etiology.  Feeling stable. Lyrica helps with the paresthesias. Feels it impacts her mentation.  But if she doesn't take the lyrica it does feel ok.   Interval history: B12 was 168 2 years ago. Her paresthesias are improved on B12 supplementation. Discussed differential neuropathy. She still does have paresthesias but they are improved. We checked multiple labs and B12 deficiency was the only predisposing factors for neuropathy. However there are several more we can try including Sjogren's which also can cause pure sensory neuropathy. Her rheumatoid factor was minimally elevated in the past so we can recheck with CCP antibodies. Also we can  check vitamin B1 space, heavy metals, hemoglobin A1c and repeat B12 and folate.  Interval history: she started taking B12 and her symptoms appeared to be improved. She still has some tingling. She still ahs paresthesias in the fingers and the toes. Discussed B12 deficiency in detail, she takes 1098mg a day. She can increase it to 20040m daily. Will perform  emg/ncs to assess for any other cause of paresthjesias.   HPI:  DoROXI HLAVATYs a [age].o. female here as a referral from Dr. GrLaurann Montanaor Paresthesias. The week after mother's day she felt numbness, tingling in all the limbs. Started in the right arm, like her arm was asleep. Also numbness and tingly in the lips. Started slowly and progressed. Got significantly worse in May. Went away for a week with just some paresthesias at night. Is waxing and waning. At it's height in July around the 18th she had a week of the symptoms and it was getting very intense and her heart started beating really fast, they went to the emergency room and she was tachycardic. Paresthesias went up to 8/10 in discomfort. They go away for a few weeks and then come back. Symptoms always start slowly and ascend with a strange sensation of paresthesias and numbness, always starting in the right arm. No neck pain, no back pain, no weakness. She has warm sensations, maybe entering menopause, b which tends to happen at the same time as the paresthesias. No diabetes. She works out regularly, runs. Went ot the cardiologist and EKG was normal. Right arm always more intense. No other focal symptoms, denies weakness, vision changes, speech or swallow problems. She has an overactive bladder. Nothing seems to make symptoms worse or trigger them, better if walking around or moving. No SOB. Sometimes when sitting for long periods has the urge to move legs and also gets the same sensations in bed, like she has to move legs, as in restless leg syndrome. Not sleeping well, wakes every hour or two. Not snoring, no apneic episodes. Partner doesn't notice abnormal movements. No recent depression or stress. Life has been less stressful since son moved out of house and she feels good. No cramping. Balance is fine. No dizzyness. Brain feels "fuzzy" at times. No recent illness or preceding illness. Some cognitive "fuzziness" for a few months. No headaches. Today  her lips are numb symmetrically around the lips.   She currently has the paresthesias, started 8 days ago.  Previous to this this she had gone 21 days without symptoms, had a small bout the beginning of august. Describes symptoms of pins and needles up the whole arm up to shoulder on right, left arm in the fingers, legs to the knees, and around the lips.     CT of the head 03/10/2018 was normal (reviewed images)   Reviewed notes, labs and imaging from outside physicians, which showed recent CBC unremarkable, BMP with K 3.4. TSH 2.08.    Review of Systems: Patient complains of symptoms per HPI as well as the following symptoms: paresthesias . Pertinent negatives and positives per HPI. All others negative      Social History   Socioeconomic History   Marital status: Married    Spouse name: TiChristia Reading  Number of children: 3   Years of education: 12+   Highest education level: Not on file  Occupational History   Occupation: enrollment    Employer: [employer]  Tobacco Use   Smoking status: Never   Smokeless  tobacco: Never  Vaping Use   Vaping Use: Never used  Substance and Sexual Activity   Alcohol use: No   Drug use: No   Sexual activity: Not Currently  Other Topics Concern   Not on file  Social History Narrative   Patient lives at home with husband [name].    Patient has 3 children.    Patient has a college education.    Patient is right handed.    Drinks 2-3 cups of caffeine daily   Social Determinants of Health   Financial Resource Strain: Not on file  Food Insecurity: Not on file  Transportation Needs: Not on file  Physical Activity: Not on file  Stress: Not on file  Social Connections: Not on file  Intimate Partner Violence: Not on file    Family History  Problem Relation Age of Onset   Heart disease Mother    Hypertension Mother    Prostate cancer Father    Other Father        MDS   Hypertension Brother    Hypertension Son    Neuropathy  Neg Hx     Past Medical History:  Diagnosis Date   B12 deficiency    Basal cell carcinoma    Hypercholesteremia     Past Surgical History:  Procedure Laterality Date   APPENDECTOMY  1992   Excision of BCC R arm      Current Outpatient Medications  Medication Sig Dispense Refill   Calcium Citrate-Vitamin D (CALCIUM + D PO) Take by mouth daily.     Cyanocobalamin (VITAMIN B12 PO) Take by mouth.     fluticasone (FLONASE) 50 MCG/ACT nasal spray Place 1 spray into both nostrils daily. Begin by using 2 sprays in each nare daily for 3 to 5 days, then decrease to 1 spray in each nare daily. 32 mL 1   atorvastatin (LIPITOR) 10 MG tablet Take 1 tablet (10 mg total) by mouth daily at 6 PM. 90 tablet 4   cetirizine (ZYRTEC ALLERGY) 10 MG tablet Take 1 tablet (10 mg total) by mouth at bedtime. 30 tablet 2   pregabalin (LYRICA) 50 MG capsule Take 1 capsule (50 mg total) by mouth daily. Please call [phone] to schedule a follow up appt. 90 capsule 5   No current facility-administered medications for this visit.    Allergies as of 10/26/2022   (No Known Allergies)    Vitals: BP 117/68   Pulse 80   Ht '5\' 8"'$  (1.727 m)   Wt 139 lb 9.6 oz (63.3 kg)   BMI 21.23 kg/m  Last Weight:  Wt Readings from Last 1 Encounters:  10/26/22 139 lb 9.6 oz (63.3 kg)   Last Height:   Ht Readings from Last 1 Encounters:  10/26/22 '5\' 8"'$  (1.727 m)   Exam: NAD, pleasant                  Speech:    Speech is normal; fluent and spontaneous with normal comprehension.  Cognition:    The patient is oriented to person, place, and time;     recent and remote memory intact;     language fluent;    Cranial Nerves:    The pupils are equal, round, and reactive to light.Trigeminal sensation is intact and the muscles of mastication are normal. The face is symmetric. The palate elevates in the midline. Hearing intact. Voice is normal. Shoulder shrug is normal. The tongue has normal motion without fasciculations.    Coordination:  No  dysmetria  Motor Observation:    No asymmetry, no atrophy, and no involuntary movements noted. Tone:    Normal muscle tone.     Strength:    Strength is V/V in the upper and lower limbs.      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 She has improved on B12 supplementation however continues to have paresthesias in the arms and legs, weakness, need to rule out subacute combined degeneration, myelopathy, demyelinating disease such as MS or other etiology and emg/ncs. She has been stable since supplementing B12 will monitor clinically.   This is a lovely 62 year old female who I been following for several years for paresthesias.  Initially seen in September 2015 for numbness and tingling in all the limbs which started in the right arm, also numbness and tingling in the lips, started slowly and then progressed, significantly worsening, intense.  The right arm was always worse.  An extensive work-up showed B12 168.  CT of the head in 2019 was normal.  She started taking B12 and her symptoms appeared to be improving, over the years her B12 supplementation has improved her B12, we checked multiple other blood work including autoimmune disorders, heavy metals, hemoglobin A1c, B1, folate, TSH, vitamin D, CCP, B1, B6, B12 and folate, RPR, Lyme, IFE and PE, ANA with reflex, Sjogren's, rheumatoid factor, hepatitis, Lyme, sed rate, HIV, angiotensin-converting enzyme.  MRI of the brain amd cervical spine was ordered but patient never completed it.  she reports she continues to have paresthesias mostly in her limbs, weakness moreso of the right arm, at this point we ordered but she never completed MRI of the brain and an MRI of the cervical spine to evaluate for strokes or demyelinating lesions or other etiology.  10/26/2022: She says she never had the MRI of the brain or cervical  spine(ordered twice, twice did not have it completed). She doesn't have a pcp. Encouraged her to get one, she needs a colonoscopy and mammogram and routine checkups and dexa scan, pap smears etc and other things she can be evaluated for primary care. She still has paresthesias, stable, she just "chickened out" of the MRIs I ordered a year ago. She had an emg/ncs which was normal in the past as well as extensive serum workup. Stable. She has ringing in the ear and requests hearing testing or ENT.   Patient would like to go to ENT Dr. Erik Obey for tinnitus and hearing loss. If he is retired she can see anyone in that group, she is established there.  Orders Placed This Encounter  Procedures   Comprehensive metabolic panel   CBC with Differential/Platelets   TSH Rfx on Abnormal to Free T4   Vitamin D, 25-hydroxy   B12 and Folate Panel   Methylmalonic acid, serum   Ambulatory referral to ENT   Ambulatory referral to ENT     Sarina Ill, MD   [facility] [address] [place], [place] [address]   Phone [phone] Fax [phone]   Phone [phone] Fax [phone]   I spent more than 30  minutes of face-to-face and non-face-to-face time with patient on the  1. Bilateral hearing loss, unspecified hearing loss type   2. Tinnitus of both ears   3. B12 deficiency   4. Vitamin D deficiency   5. Other fatigue   6. Tinnitus, unspecified laterality  7. Hearing loss, unspecified hearing loss type, unspecified laterality      diagnosis.  This included previsit chart review, lab review, study review, order entry, electronic health record documentation, patient education on the different diagnostic and therapeutic options, counseling and coordination of care, risks and benefits of management, compliance, or risk factor reduction

## 2022-10-26 NOTE — Patient Instructions (Addendum)
[facility] - several locations [facility]: Address: [address] [place], [place] [address] Phone: [phone] NEED primary care: She says she never had the MRI of the brain or cervical spine. She doesn't have a pcp. Encouraged her to get one, she needs a colonoscopy and mammogram and routine checkups and dexa scan, pap smears etc and many other things she can be evaluated for primary care.  Check blood work

## 2022-10-31 ENCOUNTER — Telehealth: Payer: Self-pay | Admitting: Neurology

## 2022-10-31 LAB — COMPREHENSIVE METABOLIC PANEL
ALT: 26 IU/L (ref 0–32)
AST: 22 IU/L (ref 0–40)
Albumin/Globulin Ratio: 2.1 (ref 1.2–2.2)
Albumin: 4.6 g/dL (ref 3.9–4.9)
Alkaline Phosphatase: 70 IU/L (ref 44–121)
BUN/Creatinine Ratio: 13 (ref 12–28)
BUN: 11 mg/dL (ref 8–27)
Bilirubin Total: 0.7 mg/dL (ref 0.0–1.2)
CO2: 23 mmol/L (ref 20–29)
Calcium: 9.5 mg/dL (ref 8.7–10.3)
Chloride: 104 mmol/L (ref 96–106)
Creatinine, Ser: 0.85 mg/dL (ref 0.57–1.00)
Globulin, Total: 2.2 g/dL (ref 1.5–4.5)
Glucose: 99 mg/dL (ref 70–99)
Potassium: 4.3 mmol/L (ref 3.5–5.2)
Sodium: 141 mmol/L (ref 134–144)
Total Protein: 6.8 g/dL (ref 6.0–8.5)
eGFR: 78 mL/min/{1.73_m2} (ref >59)

## 2022-10-31 LAB — CBC WITH DIFFERENTIAL/PLATELET
Basophils Absolute: 0.1 10*3/uL (ref 0.0–0.2)
Basos: 1 %
EOS (ABSOLUTE): 0.1 10*3/uL (ref 0.0–0.4)
Eos: 2 %
Hematocrit: 38.5 % (ref 34.0–46.6)
Hemoglobin: 13 g/dL (ref 11.1–15.9)
Immature Grans (Abs): 0 10*3/uL (ref 0.0–0.1)
Immature Granulocytes: 0 %
Lymphocytes Absolute: 2.4 10*3/uL (ref 0.7–3.1)
Lymphs: 41 %
MCH: 29.5 pg (ref 26.6–33.0)
MCHC: 33.8 g/dL (ref 31.5–35.7)
MCV: 88 fL (ref 79–97)
Monocytes Absolute: 0.5 10*3/uL (ref 0.1–0.9)
Monocytes: 8 %
Neutrophils Absolute: 3 10*3/uL (ref 1.4–7.0)
Neutrophils: 48 %
Platelets: 234 10*3/uL (ref 150–450)
RBC: 4.4 x10E6/uL (ref 3.77–5.28)
RDW: 12.1 % (ref 11.7–15.4)
WBC: 6 10*3/uL (ref 3.4–10.8)

## 2022-10-31 LAB — VITAMIN D 25 HYDROXY (VIT D DEFICIENCY, FRACTURES): Vit D, 25-Hydroxy: 43 ng/mL (ref 30.0–100.0)

## 2022-10-31 LAB — B12 AND FOLATE PANEL
Folate: 12.8 ng/mL (ref >3.0)
Vitamin B-12: 1834 pg/mL — ABNORMAL HIGH (ref 232–1245)

## 2022-10-31 LAB — TSH RFX ON ABNORMAL TO FREE T4: TSH: 4.01 u[IU]/mL (ref 0.450–4.500)

## 2022-10-31 LAB — METHYLMALONIC ACID, SERUM: Methylmalonic Acid: 169 nmol/L (ref 0–378)

## 2022-10-31 NOTE — Telephone Encounter (Signed)
Referral for ENT fax to [facility].  Phone: [phone], Fax: [phone]. 

## 2022-11-03 ENCOUNTER — Telehealth: Payer: Self-pay | Admitting: *Deleted

## 2022-11-03 NOTE — Telephone Encounter (Signed)
-----   Message from Melvenia Beam, MD sent at 10/27/2022  4:12 PM EST ----- Bloodwork looks great! Have a wonderful weekend! Dr. Jaynee Eagles

## 2022-11-03 NOTE — Telephone Encounter (Signed)
Spoke to patient gave labwork results Pt expressed understanding and thanked me for calling  

## 2022-12-25 ENCOUNTER — Ambulatory Visit
Admission: EM | Admit: 2022-12-25 | Discharge: 2022-12-25 | Disposition: A | Discharge status: Home / Self Care | Payer: BC Managed Care – PPO | Attending: Physician Assistant | Admitting: Physician Assistant

## 2022-12-25 ENCOUNTER — Other Ambulatory Visit: Payer: Self-pay

## 2022-12-25 ENCOUNTER — Encounter: Payer: Self-pay | Admitting: Emergency Medicine

## 2022-12-25 DIAGNOSIS — H109 Unspecified conjunctivitis: Secondary | ICD-10-CM | POA: Diagnosis not present

## 2022-12-25 MED ORDER — DOXYCYCLINE HYCLATE 100 MG PO CAPS: 100.0000 mg | ORAL_CAPSULE | Freq: Two times a day (BID) | ORAL | 0 refills | Status: DC

## 2022-12-25 MED ORDER — TOBRAMYCIN 0.3 % OP SOLN: 1.0000 [drp] | OPHTHALMIC | 0 refills | Status: AC

## 2022-12-25 NOTE — Discharge Instructions (Addendum)
Return if any problems.

## 2022-12-25 NOTE — ED Triage Notes (Signed)
Pt here for right eye irritation, watering and swelling x 3 days

## 2022-12-26 NOTE — ED Provider Notes (Signed)
[name]    CSN: [id number] Arrival date & time: 12/25/22  1046      History   Chief Complaint Chief Complaint  Patient presents with   Eye Pain    HPI Sonya Hunt is a 62 y.o. female.   Patient complains of right eye watering drainage and swelling under her eye.  Patient reports that she has had the symptoms for the past 3 days.  Patient denies any fever or chills she has not any cough or congestion she denies any visual changes  No language interpreter was used.  Eye Pain    Past Medical History:  Diagnosis Date   B12 deficiency    Basal cell carcinoma    Hypercholesteremia     Patient Active Problem List   Diagnosis Date Noted   B12 deficiency 09/14/2015   Neuropathy 09/14/2015   Paresthesias 06/09/2014   Tachycardia 05/15/2014    Past Surgical History:  Procedure Laterality Date   APPENDECTOMY  1992   Excision of BCC R arm      OB History   No obstetric history on file.      Home Medications    Prior to Admission medications   Medication Sig Start Date End Date Taking? Authorizing Provider  doxycycline (VIBRAMYCIN) 100 MG capsule Take 1 capsule (100 mg total) by mouth 2 (two) times daily. 12/25/22  Yes Caryl Ada K, PA-C  tobramycin (TOBREX) 0.3 % ophthalmic solution Place 1 drop into the right eye every 4 (four) hours for 10 days. 12/25/22 01/04/23 Yes Caryl Ada K, PA-C  atorvastatin (LIPITOR) 10 MG tablet Take 1 tablet (10 mg total) by mouth daily at 6 PM. 10/26/22   Melvenia Beam, MD  Calcium Citrate-Vitamin D (CALCIUM + D PO) Take by mouth daily.    [provider]  cetirizine (ZYRTEC ALLERGY) 10 MG tablet Take 1 tablet (10 mg total) by mouth at bedtime. 12/19/21 03/19/22  Lynden Oxford Scales, PA-C  Cyanocobalamin (VITAMIN B12 PO) Take by mouth.    [provider]  fluticasone (FLONASE) 50 MCG/ACT nasal spray Place 1 spray into both nostrils daily. Begin by using 2 sprays in each nare daily for 3 to 5  days, then decrease to 1 spray in each nare daily. 12/19/21   Lynden Oxford Scales, PA-C  pregabalin (LYRICA) 50 MG capsule Take 1 capsule (50 mg total) by mouth daily. Please call [phone] to schedule a follow up appt. 10/26/22   Melvenia Beam, MD    Family History Family History  Problem Relation Age of Onset   Heart disease Mother    Hypertension Mother    Prostate cancer Father    Other Father        MDS   Hypertension Brother    Hypertension Son    Neuropathy Neg Hx     Social History Social History   Tobacco Use   Smoking status: Never   Smokeless tobacco: Never  Vaping Use   Vaping Use: Never used  Substance Use Topics   Alcohol use: No   Drug use: No     Allergies   Patient has no known allergies.   Review of Systems Review of Systems  Eyes:  Positive for pain.  All other systems reviewed and are negative.    Physical Exam Triage Vital Signs ED Triage Vitals [12/25/22 1057]  Enc Vitals Group     BP 134/72     Pulse Rate 85     Resp 18  Temp 98 F (36.7 C)     Temp Source Oral     SpO2 99 %     Weight      Height      Head Circumference      Peak Flow      Pain Score 2     Pain Loc      Pain Edu?      Excl. in [place]?    No data found.  Updated Vital Signs BP 134/72 (BP Location: Left Arm)   Pulse 85   Temp 98 F (36.7 C) (Oral)   Resp 18   SpO2 99%   Visual Acuity Right Eye Distance:   Left Eye Distance:   Bilateral Distance:    Right Eye Near:   Left Eye Near:    Bilateral Near:     Physical Exam Vitals and nursing note reviewed.  Constitutional:      Appearance: She is well-developed.  HENT:     Head: Normocephalic.  Eyes:     Comments: Swelling right eyelid conjunctiva injected  Cardiovascular:     Rate and Rhythm: Normal rate.  Pulmonary:     Effort: Pulmonary effort is normal.  Abdominal:     General: There is no distension.  Musculoskeletal:        General: Normal range of motion.     Cervical back:  Normal range of motion.  Skin:    General: Skin is warm.  Neurological:     General: No focal deficit present.     Mental Status: She is alert and oriented to person, place, and time.      UC Treatments / Results  Labs (all labs ordered are listed, but only abnormal results are displayed) Labs Reviewed - No data to display  EKG   Radiology No results found.  Procedures Procedures (including critical care time)  Medications Ordered in UC Medications - No data to display  Initial Impression / Assessment and Plan / UC Course  I have reviewed the triage vital signs and the nursing notes.  Pertinent labs & imaging results that were available during my care of the patient were reviewed by me and considered in my medical decision making (see chart for details).      Final Clinical Impressions(s) / UC Diagnoses   Final diagnoses:  Conjunctivitis of right eye, unspecified conjunctivitis type     Discharge Instructions      Return if any problems.    ED Prescriptions     Medication Sig Dispense Auth. Provider   tobramycin (TOBREX) 0.3 % ophthalmic solution Place 1 drop into the right eye every 4 (four) hours for 10 days. 5 mL Caryl Ada K, [place]   doxycycline (VIBRAMYCIN) 100 MG capsule Take 1 capsule (100 mg total) by mouth 2 (two) times daily. 20 capsule Fransico Meadow, [place]      PDMP not reviewed this encounter. An After Visit Summary was printed and given to the patient.       Fransico Meadow, [place] 12/26/22 1734

## 2023-05-09 ENCOUNTER — Other Ambulatory Visit: Payer: Self-pay | Admitting: Neurology

## 2023-05-15 NOTE — Telephone Encounter (Signed)
Pt called wanting to know when her pregabalin (LYRICA) 50 MG capsule will be called in. Please advise.

## 2023-05-17 NOTE — Telephone Encounter (Signed)
Pt called again wanting to know when she can get her medication. Please advise.

## 2023-05-18 ENCOUNTER — Telehealth: Payer: Self-pay | Admitting: *Deleted

## 2023-05-18 ENCOUNTER — Other Ambulatory Visit: Payer: Self-pay | Admitting: *Deleted

## 2023-05-18 NOTE — Telephone Encounter (Signed)
Spoke to Sonya Hunt made her aware refill request was already sent to Dr Lucia Gaskins

## 2023-05-22 MED ORDER — PREGABALIN 50 MG PO CAPS
50.0000 mg | ORAL_CAPSULE | Freq: Every day | ORAL | 5 refills | Status: DC
Start: 2023-05-22 — End: 2023-11-13

## 2023-10-25 ENCOUNTER — Telehealth: Payer: Self-pay | Admitting: Neurology

## 2023-10-25 NOTE — Telephone Encounter (Signed)
Reschedule appointment due to time is not going to work

## 2023-11-01 ENCOUNTER — Ambulatory Visit: Payer: BC Managed Care – PPO | Admitting: Neurology

## 2023-11-11 ENCOUNTER — Other Ambulatory Visit: Payer: Self-pay | Admitting: Neurology

## 2023-11-13 ENCOUNTER — Other Ambulatory Visit: Payer: Self-pay | Admitting: Neurology

## 2024-03-27 ENCOUNTER — Encounter: Payer: Self-pay | Admitting: Neurology

## 2024-03-27 ENCOUNTER — Ambulatory Visit: Payer: BC Managed Care – PPO | Admitting: Neurology

## 2024-03-27 ENCOUNTER — Ambulatory Visit (INDEPENDENT_AMBULATORY_CARE_PROVIDER_SITE_OTHER): Admitting: Neurology

## 2024-03-27 VITALS — BP 144/89 | HR 84 | Ht 68.0 in | Wt 145.0 lb

## 2024-03-27 DIAGNOSIS — E538 Deficiency of other specified B group vitamins: Secondary | ICD-10-CM

## 2024-03-27 DIAGNOSIS — R202 Paresthesia of skin: Secondary | ICD-10-CM

## 2024-03-27 DIAGNOSIS — G629 Polyneuropathy, unspecified: Secondary | ICD-10-CM

## 2024-03-27 NOTE — Progress Notes (Addendum)
 [facility]    Provider:  Dr Ines Referring Provider: No ref. provider found Primary Care Physician:  Patient, [name]   CC:  Paresthesias  03/27/2024: It is the same. All the symptoms started 10 years ago. The B12 helped. She feels itis manageable. We discussed mri brain and cervical spine, also emg/ncs. Nothing has worsened. At this time will follow prn. Patient complains of symptoms per HPI as well as the following symptoms: none . Pertinent negatives and positives per HPI. All others negative. No other focal neurologic deficits, associated symptoms, inciting events or modifiable factors.   10/26/2022: She says she never went through the MRI of the brain or cervical spine(ordered twice, twice did not have it completed). She doesn't have a pcp. Encouraged her to get one, she needs a colonoscopy and mammogram and routine checkups and dexa scan, pap smears etc and other things she can be evaluated for primary care. She still has paresthesias, she just chickened out of the MRIs I ordered a year ago. She had an emg/ncs which was normal in the past as well as extensive serum workup. Stable. She has ringing in the ear and requests hearing testing or ENT, has been to [facility] in [facility] past, will send again, if he is not there he can see any provider in that group as she is established there. No other focal neurologic deficits, associated symptoms, inciting events or modifiable factors.  Patient complains of symptoms per HPI as well as the following symptoms: anxiety . Pertinent negatives and positives per HPI. All others negative   07/14/2021: [name] who I been following for several years for paresthesias.  Initially seen in September 2015 for numbness and tingling in all the limbs which started in the right arm, also numbness and tingling in the lips, started slowly and then progressed, significantly worsening, intense.  The right arm was always  worse.  An extensive work-up showed B12 168.  CT of the head in 2019 was normal.  She started taking B12 and her symptoms appeared to be improving, over the years her B12 supplementation has improved her B12, we checked multiple other blood work including autoimmune disorders, heavy metals, hemoglobin A1c, B1, folate, TSH, vitamin D , CCP, B1, B6, B12 and folate, RPR, Lyme, IFE and PE, ANA with reflex, Sjogren's, rheumatoid factor, hepatitis, Lyme, sed rate, HIV, angiotensin-converting enzyme.  MRI of the cervical spine was ordered but patient never completed it.  Today she reports she continues to have paresthesias mostly in her limbs, weakness moreso of the right arm, at this point we need to check an MRI of the brain and an MRI of the cervical spine to evaluate for strokes or demyelinating lesions or other etiology.   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 Doing much better. On supplementation. Extensive panel did not reveal any other etiology. She is stable, she still has numbness and tingling, it never goes away, her body always feels strange. In all the limbs.  She has balance problems sometimes, she has numbness and tingling in the arms and legs, no neck pain.    Interval history: Initial B12 was 168.  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 Extensive panel did not reveal any other etiology.  Feeling stable. Lyrica  helps with the paresthesias. Feels it impacts her mentation.  But if she doesn't take the lyrica  it does feel ok.   Interval history: B12  was 168 2 years ago. Her paresthesias are improved on B12 supplementation. Discussed differential neuropathy. She still does have paresthesias but they are improved. We checked multiple labs and B12 deficiency was the only predisposing factors for neuropathy. However there are several more we can try including Sjogren's which also can cause pure sensory neuropathy. Her  rheumatoid factor was minimally elevated in the past so we can recheck with CCP antibodies. Also we can check vitamin B1 space, heavy metals, hemoglobin A1c and repeat B12 and folate.  Interval history: she started taking B12 and her symptoms appeared to be improved. She still has some tingling. She still ahs paresthesias in the fingers and the toes. Discussed B12 deficiency in detail, she takes a day. She can increase it to 2000mcg daily. Will perform emg/ncs to assess for any other cause of paresthjesias.   HPI:  Sonya Hunt is a 63 y.o. female here as a referral from Dr. Signa for Paresthesias. The week after mother's day she felt numbness, tingling in all the limbs. Started in the right arm, like her arm was asleep. Also numbness and tingly in the lips. Started slowly and progressed. Got significantly worse in May. Went away for a week with just some paresthesias at night. Is waxing and waning. At it's height in July around the 18th she had a week of the symptoms and it was getting very intense and her heart started beating really fast, they went to the emergency room and she was tachycardic. Paresthesias went up to 8/10 in discomfort. They go away for a few weeks and then come back. Symptoms always start slowly and ascend with a strange sensation of paresthesias and numbness, always starting in the right arm. No neck pain, no back pain, no weakness. She has warm sensations, maybe entering menopause, b which tends to happen at the same time as the paresthesias. No diabetes. She works out regularly, runs. Went ot the cardiologist and EKG was normal. Right arm always more intense. No other focal symptoms, denies weakness, vision changes, speech or swallow problems. She has an overactive bladder. Nothing seems to make symptoms worse or trigger them, better if walking around or moving. No SOB. Sometimes when sitting for long periods has the urge to move legs and also gets the same sensations in bed,  like she has to move legs, as in restless leg syndrome. Not sleeping well, wakes every hour or two. Not snoring, no apneic episodes. Partner doesn't notice abnormal movements. No recent depression or stress. Life has been less stressful since son moved out of house and she feels good. No cramping. Balance is fine. No dizzyness. Brain feels fuzzy at times. No recent illness or preceding illness. Some cognitive fuzziness for a few months. No headaches. Today her lips are numb symmetrically around the lips.   She currently has the paresthesias, started 8 days ago.  Previous to this this she had gone 21 days without symptoms, had a small bout the beginning of august. Describes symptoms of pins and needles up the whole arm up to shoulder on right, left arm in the fingers, legs to the knees, and around the lips.     CT of the head 03/10/2018 was normal (reviewed images)   Reviewed notes, labs and imaging from outside physicians, which showed recent CBC unremarkable, BMP with K 3.4. TSH 2.08.    Review of Systems: Patient complains of symptoms per HPI as well as the following symptoms: paresthesias . Pertinent negatives and positives  per HPI. All others negative      Social History   Socioeconomic History   Marital status: Married    Spouse name: Evalene    Number of children: 3   Years of education: 12+   Highest education level: Not on file  Occupational History   Occupation: enrollment    Employer: [employer]  Tobacco Use   Smoking status: Never   Smokeless tobacco: Never  Vaping Use   Vaping status: Never Used  Substance and Sexual Activity   Alcohol use: No   Drug use: No   Sexual activity: Not Currently  Other Topics Concern   Not on file  Social History Narrative   Patient lives at home with husband Evalene.    Patient has 3 children.    Patient has a college education.    Patient is right handed.    Drinks 1 cup of caffeine daily   Social Drivers of Manufacturing engineer Strain: Not on file  Food Insecurity: Not on file  Transportation Needs: Not on file  Physical Activity: Not on file  Stress: Not on file  Social Connections: Not on file  Intimate Partner Violence: Not on file    Family History  Problem Relation Age of Onset   Heart disease Mother    Hypertension Mother    Prostate cancer Father    Other Father        MDS   Hypertension Brother    Hypertension Son    Neuropathy Neg Hx     Past Medical History:  Diagnosis Date   B12 deficiency    Basal cell carcinoma    Hypercholesteremia     Past Surgical History:  Procedure Laterality Date   APPENDECTOMY  1992   Excision of BCC R arm      Current Outpatient Medications  Medication Sig Dispense Refill   atorvastatin  (LIPITOR) 10 MG tablet TAKE 1 TABLET (10 MG TOTAL) BY MOUTH DAILY AT 6 PM. 90 tablet 1   Calcium  Citrate-Vitamin D  (CALCIUM  + D PO) Take by mouth daily.     Cyanocobalamin (VITAMIN B12 PO) Take by mouth.     pregabalin  (LYRICA ) 50 MG capsule TAKE 1 CAPSULE (50 MG TOTAL) BY MOUTH DAILY. PLEASE CALL [phone] TO SCHEDULE A FOLLOW UP APPT. 30 capsule 5   No current facility-administered medications for this visit.    Allergies as of 03/27/2024   (No Known Allergies)    Vitals: BP (!) 144/89 (BP Location: Left Arm, Patient Position: Sitting, Cuff Size: Normal)   Pulse 84   Ht 5' 8 (1.727 m)   Wt 145 lb (65.8 kg)   BMI 22.05 kg/m  Last Weight:  Wt Readings from Last 1 Encounters:  03/27/24 145 lb (65.8 kg)   Last Height:   Ht Readings from Last 1 Encounters:  03/27/24 5' 8 (1.727 m)   Physical exam: Exam: Gen: NAD, conversant      CV: No palpitations or chest pain or SOB. VS: Breathing at a normal rate. Weight appears within normal limits. Not febrile. Eyes: Conjunctivae clear without exudates or hemorrhage  Neuro: Detailed Neurologic Exam  Speech:    Speech is normal; fluent and spontaneous with normal comprehension.   Cognition:    The patient is oriented to person, place, and time;     recent and remote memory intact;     language fluent;     normal attention, concentration, fund of knowledge Cranial Nerves:  The pupils are equal, round, and reactive to light. Visual fields are full Extraocular movements are intact.  The face is symmetric with normal sensation. The palate elevates in the midline. Hearing intact. Voice is normal. Shoulder shrug is normal. The tongue has normal motion without fasciculations.   Coordination: normal  Gait:    No abnormalities noted or reported  Motor Observation:   no involuntary movements noted. Tone:    Appears normal  Posture:    Posture is normal. normal erect    Strength:    Strength is anti-gravity and symmetric in the upper and lower limbs.      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 She has improved on B12 supplementation however continues to have paresthesias in the arms and legs, weakness, need to rule out subacute combined degeneration, myelopathy, demyelinating disease such as MS or other etiology and emg/ncs. She has been stable since supplementing B12 will monitor clinically.   Doing well, B12 deficiency, continue B12 supplementation for life, follow up as needed  05/08/2024 Have discussed that patient needs a primary care. We can refill lipitor again now but next time she needs to see pcp, get a repeat cholesterol test and have pcp manage thank you    [name] who I been following for several years for paresthesias.  Initially seen in September 2015 for numbness and tingling in all the limbs which started in the right arm, also numbness and tingling in the lips, started slowly and then progressed, significantly worsening, intense.  The right arm was  always worse.  An extensive work-up showed B12 168.  CT of the head in 2019 was normal.  She started taking B12 and her symptoms appeared to be improving, over the years her B12 supplementation has improved her B12, we checked multiple other blood work including autoimmune disorders, heavy metals, hemoglobin A1c, B1, folate, TSH, vitamin D , CCP, B1, B6, B12 and folate, RPR, Lyme, IFE and PE, ANA with reflex, Sjogren's, rheumatoid factor, hepatitis, Lyme, sed rate, HIV, angiotensin-converting enzyme.  MRI of the brain amd cervical spine was ordered but patient never completed it.  she reports she continues to have paresthesias mostly in her limbs, weakness moreso of the right arm, at this point we ordered but she never completed MRI of the brain and an MRI of the cervical spine to evaluate for strokes or demyelinating lesions or other etiology.  05/08/2024 Have discussed that patient needs a primary care. We can refill lipitor again now but next time she needs to see pcp, get a repeat cholesterol test and have pcp manage thank you   Onetha Epp, MD   [facility] [address] [place], [place] [address]   Phone [phone] Fax [phone]   Phone [phone] Fax [phone]   I spent more than 10  minutes of face-to-face and non-face-to-face time with patient on the  1. Neuropathy   2. Paresthesias   3. B12 deficiency       diagnosis.  This included previsit chart review, lab review, study review, order entry, electronic health record documentation, patient education on the different diagnostic and therapeutic options, counseling and coordination of care, risks and benefits of management, compliance, or risk factor reduction

## 2024-05-05 ENCOUNTER — Other Ambulatory Visit: Payer: Self-pay | Admitting: Neurology

## 2024-05-09 ENCOUNTER — Other Ambulatory Visit: Payer: Self-pay | Admitting: Neurology

## 2024-05-13 NOTE — Telephone Encounter (Signed)
 Called pt at [phone]. Relayed Dr. Sharion message. She verbalized understanding. She will work on getting established with PCP to have them manage refills in the future and monitor labs.

## 2024-06-20 ENCOUNTER — Other Ambulatory Visit: Payer: Self-pay | Admitting: Neurology

## 2024-07-01 ENCOUNTER — Telehealth: Payer: Self-pay | Admitting: Neurology

## 2024-07-01 NOTE — Telephone Encounter (Signed)
 Pt asking to be called for assignment to MD

## 2024-07-02 MED ORDER — PREGABALIN 50 MG PO CAPS
50.0000 mg | ORAL_CAPSULE | Freq: Every day | ORAL | 5 refills | Status: AC
Start: 2024-07-02 — End: ?

## 2024-07-02 NOTE — Addendum Note (Signed)
 Addended by: HILLIARD HEATHER CROME on: 07/02/2024 10:16 AM   Modules accepted: Orders

## 2024-07-02 NOTE — Telephone Encounter (Signed)
 Last visit: 03/27/24  Next visit: 08/27/24  Per epic adherence report, last fills:

## 2024-07-02 NOTE — Telephone Encounter (Signed)
 Spoke with patient and rescheduled follow up with Dr. Margaret for 08/27/24 at 2:30pm. Pt is also needing a refill of pregabalin  (LYRICA ) 50 MG capsule

## 2024-07-02 NOTE — Addendum Note (Signed)
 Addended by: MARGARET CARNE R on: 07/02/2024 01:13 PM   Modules accepted: Orders

## 2024-07-02 NOTE — Telephone Encounter (Signed)
 Meds ordered this encounter  Medications   pregabalin  (LYRICA ) 50 MG capsule    Sig: Take 1 capsule (50 mg total) by mouth daily.    Dispense:  30 capsule    Refill:  5    Last refill 05/15/24 #30/30   EDUARD FABIENE HANLON, MD 07/02/2024, 1:12 PM Certified in Neurology, Neurophysiology and Neuroimaging  [facility] [address], [address] [place], [place] [address] [phone]

## 2024-08-27 ENCOUNTER — Ambulatory Visit: Admitting: Diagnostic Neuroimaging

## 2025-04-08 ENCOUNTER — Ambulatory Visit: Admitting: Diagnostic Neuroimaging
